# Patient Record
Sex: Male | Born: 1975 | Race: White | Hispanic: No | Marital: Single | Smoking: Current every day smoker
Health system: Southern US, Community
[De-identification: ages and names within clinical notes are randomized; demographics above are authoritative.]

## PROBLEM LIST (undated history)

## (undated) DIAGNOSIS — F32A Depression, unspecified: Secondary | ICD-10-CM

## (undated) DIAGNOSIS — M1990 Unspecified osteoarthritis, unspecified site: Secondary | ICD-10-CM

## (undated) DIAGNOSIS — F419 Anxiety disorder, unspecified: Secondary | ICD-10-CM

## (undated) DIAGNOSIS — K420 Umbilical hernia with obstruction, without gangrene: Secondary | ICD-10-CM

## (undated) DIAGNOSIS — B182 Chronic viral hepatitis C: Secondary | ICD-10-CM

## (undated) DIAGNOSIS — F1910 Other psychoactive substance abuse, uncomplicated: Secondary | ICD-10-CM

## (undated) HISTORY — DX: Chronic viral hepatitis C: B18.2

---

## 2004-08-27 ENCOUNTER — Emergency Department (HOSPITAL_COMMUNITY): Admission: EM | Admit: 2004-08-27 | Discharge: 2004-08-27 | Payer: Self-pay | Admitting: Emergency Medicine

## 2016-09-02 ENCOUNTER — Emergency Department (HOSPITAL_BASED_OUTPATIENT_CLINIC_OR_DEPARTMENT_OTHER)
Admission: EM | Admit: 2016-09-02 | Discharge: 2016-09-02 | Disposition: A | Discharge status: Home / Self Care | Payer: BLUE CROSS/BLUE SHIELD | Attending: Emergency Medicine | Admitting: Emergency Medicine

## 2016-09-02 ENCOUNTER — Encounter (HOSPITAL_BASED_OUTPATIENT_CLINIC_OR_DEPARTMENT_OTHER): Payer: Self-pay | Admitting: *Deleted

## 2016-09-02 DIAGNOSIS — L0231 Cutaneous abscess of buttock: Secondary | ICD-10-CM | POA: Diagnosis present

## 2016-09-02 DIAGNOSIS — F17200 Nicotine dependence, unspecified, uncomplicated: Secondary | ICD-10-CM | POA: Insufficient documentation

## 2016-09-02 DIAGNOSIS — L739 Follicular disorder, unspecified: Secondary | ICD-10-CM | POA: Diagnosis not present

## 2016-09-02 MED ORDER — CEPHALEXIN 500 MG PO CAPS: 500.0000 mg | ORAL_CAPSULE | Freq: Four times a day (QID) | ORAL | 0 refills | Status: DC

## 2016-09-02 MED ORDER — MUPIROCIN CALCIUM 2 % EX CREA: 1.0000 "application " | TOPICAL_CREAM | Freq: Two times a day (BID) | CUTANEOUS | 0 refills | Status: DC

## 2016-09-02 MED ORDER — MUPIROCIN CALCIUM 2 % NA OINT: TOPICAL_OINTMENT | NASAL | 0 refills | Status: DC

## 2016-09-02 NOTE — ED Provider Notes (Signed)
[facility] Provider [facility]   CSN: [id number] Arrival date & time: 09/02/16  1132     History   Chief Complaint Chief Complaint  Patient presents with  . Abscess    HPI Chris Hamilton is a 41 y.o. male with no pertinent past medical history presents with multiple, small pustules to his left axilla that he noticed 2 days ago. Patient states he has similar lesions on his back, has had this for many years but never on his axilla. Patient states that his roommate had to go to the emergency department for an abscess on his arm last week. Patient states that his roommate's girlfriend had multiple abscesses on her buttock. Patient is concerned that he has a staph infection that was obtained from his roommate and his girlfriend. Patient denies fevers. Patient denies previous abscesses or pustules to his axilla. Patient states he does not shave his axilla, states that he trimmed the hair yesterday so he could see the lesions on his skin better. Patient is requesting antibiotics. No h/o DM. No recent exposure to hot tubs.  No new perfumes, lotions, deodorants.  Rash only on left axilla, nowhere else in his body.  HPI  History reviewed. No pertinent past medical history.  There are no active problems to display for this patient.   History reviewed. No pertinent surgical history.     Home Medications    Prior to Admission medications   Medication Sig Start Date End Date Taking? Authorizing Provider  cephALEXin (KEFLEX) 500 MG capsule Take 1 capsule (500 mg total) by mouth 4 (four) times daily. 09/02/16   Liberty Handylaudia J Jhair Witherington, PA-C  mupirocin nasal ointment (BACTROBAN) 2 % Apply in each nostril daily 09/02/16   Liberty Handylaudia J Naylea Wigington, PA-C    Family History No family history on file.  Social History Social History  Substance Use Topics  . Smoking status: Current Every Day Smoker  . Smokeless tobacco: Never Used  . Alcohol use No     Allergies   Patient has no known  allergies.   Review of Systems Review of Systems  Constitutional: Negative for chills and fever.  HENT: Negative for congestion.   Eyes: Negative for visual disturbance.  Respiratory: Negative for cough and shortness of breath.   Cardiovascular: Negative for chest pain.  Gastrointestinal: Negative for abdominal pain, constipation, diarrhea, nausea and vomiting.  Genitourinary: Negative for difficulty urinating, flank pain and hematuria.  Musculoskeletal: Negative for joint swelling and myalgias.  Skin: Positive for rash (pustules).  Neurological: Negative for headaches.  Hematological: Negative.   Psychiatric/Behavioral: Negative.      Physical Exam Updated Vital Signs BP 134/92   Pulse 92   Temp 97.8 F (36.6 C) (Oral)   Resp 20   Ht 6\' 2"  (1.88 m)   Wt 99.8 kg   SpO2 100%   BMI 28.25 kg/m   Physical Exam  Constitutional: He is oriented to person, place, and time. He appears well-developed and well-nourished. No distress.  NAD.  HENT:  Head: Normocephalic and atraumatic.  Right Ear: External ear normal.  Left Ear: External ear normal.  Eyes: Conjunctivae and EOM are normal. Pupils are equal, round, and reactive to light. No scleral icterus.  Neck: Normal range of motion. Neck supple. No JVD present. No tracheal deviation present.  Cardiovascular: Normal rate, regular rhythm and normal heart sounds.   No murmur heard. Pulmonary/Chest: Effort normal and breath sounds normal. He has no wheezes.  Musculoskeletal: Normal range of motion. He exhibits no  deformity.  Lymphadenopathy:    He has no cervical adenopathy.  Neurological: He is alert and oriented to person, place, and time.  Skin: Skin is warm and dry. Capillary refill takes less than 2 seconds.  Multiple, tiny pustules on left axilla without surrounding erythema or discharge.  No induration, fluctuance or signs of abscess of cellulitis.   Psychiatric: He has a normal mood and affect. His behavior is normal.  Judgment and thought content normal.  Nursing note and vitals reviewed.    ED Treatments / Results  Labs (all labs ordered are listed, but only abnormal results are displayed) Labs Reviewed - No data to display  EKG  EKG Interpretation None       Radiology No results found.  Procedures Procedures (including critical care time)  Medications Ordered in ED Medications - No data to display   Initial Impression / Assessment and Plan / ED Course  I have reviewed the triage vital signs and the nursing notes.  Pertinent labs & imaging results that were available during my care of the patient were reviewed by me and considered in my medical decision making (see chart for details).     Signs and symptoms most consistent with folliculitis. No signs of fluctuance, induration or cellulitis on exam. Patient does not have a history of diabetes or other medical conditions that would put him at risk for developing worsening skin infection. I suspect patient will respond appropriately to antibiotic ointment. Patient provided reassurance. Patient requested for oral antibiotics, I discussed risks of overusing antibiotics that could lead to resistance, patient verbalized understanding and instill one antibiotics. Patient discharged with Keflex.  Final Clinical Impressions(s) / ED Diagnoses   Final diagnoses:  Folliculitis    New Prescriptions Discharge Medication List as of 09/02/2016 12:51 PM    START taking these medications   Details  cephALEXin (KEFLEX) 500 MG capsule Take 1 capsule (500 mg total) by mouth 4 (four) times daily., Starting Fri 09/02/2016, Print    mupirocin cream (BACTROBAN) 2 % Apply 1 application topically 2 (two) times daily., Starting Fri 09/02/2016, Print         Liberty Handy, PA-C 09/02/16 1610    Alvira Monday, MD 09/05/16 [phone]

## 2016-09-02 NOTE — ED Triage Notes (Signed)
Abscess to his left axilla.

## 2016-09-02 NOTE — Discharge Instructions (Signed)
Your symptoms are most consistent with folliculitis or infection of the hair follicle. Please apply topical antibiotic cream to affected area, ice to decrease inflammation, avoid picking at the skin, keep area dry and to decrease inflammation. He requested antibiotics today, these were given to you.  Return to the emergency department if notice swelling, redness, tenderness as you may develop an abscess which may need to be drained.

## 2020-02-27 ENCOUNTER — Ambulatory Visit (HOSPITAL_COMMUNITY): Payer: No Payment, Other | Admitting: Licensed Clinical Social Worker

## 2020-03-17 ENCOUNTER — Ambulatory Visit (HOSPITAL_COMMUNITY): Payer: No Payment, Other | Admitting: Psychiatry

## 2020-06-02 ENCOUNTER — Ambulatory Visit (HOSPITAL_COMMUNITY): Payer: No Payment, Other | Admitting: Behavioral Health

## 2020-07-25 DIAGNOSIS — B029 Zoster without complications: Secondary | ICD-10-CM

## 2020-07-25 HISTORY — DX: Zoster without complications: B02.9

## 2021-02-16 ENCOUNTER — Emergency Department (HOSPITAL_COMMUNITY)
Admission: EM | Admit: 2021-02-16 | Discharge: 2021-02-16 | Disposition: A | Discharge status: Home / Self Care | Payer: Self-pay | Attending: Emergency Medicine | Admitting: Emergency Medicine

## 2021-02-16 ENCOUNTER — Other Ambulatory Visit: Payer: Self-pay

## 2021-02-16 ENCOUNTER — Encounter (HOSPITAL_COMMUNITY): Payer: Self-pay

## 2021-02-16 DIAGNOSIS — Z5321 Procedure and treatment not carried out due to patient leaving prior to being seen by health care provider: Secondary | ICD-10-CM | POA: Insufficient documentation

## 2021-02-16 DIAGNOSIS — M79672 Pain in left foot: Secondary | ICD-10-CM | POA: Insufficient documentation

## 2021-02-16 NOTE — ED Triage Notes (Signed)
Unable to sign, no pad available.

## 2021-02-16 NOTE — ED Triage Notes (Signed)
Pt states that about a month ago he started having some pain in his L foot, states that it feels like there is a piece of glass in his foot, denies trauma or stepping on something.  Pt states that he is here because he continues to have pain.

## 2021-06-13 ENCOUNTER — Emergency Department (HOSPITAL_BASED_OUTPATIENT_CLINIC_OR_DEPARTMENT_OTHER)
Admission: EM | Admit: 2021-06-13 | Discharge: 2021-06-13 | Disposition: A | Discharge status: Home / Self Care | Payer: Self-pay | Attending: Emergency Medicine | Admitting: Emergency Medicine

## 2021-06-13 ENCOUNTER — Encounter (HOSPITAL_BASED_OUTPATIENT_CLINIC_OR_DEPARTMENT_OTHER): Payer: Self-pay | Admitting: Emergency Medicine

## 2021-06-13 ENCOUNTER — Other Ambulatory Visit: Payer: Self-pay

## 2021-06-13 DIAGNOSIS — Z87891 Personal history of nicotine dependence: Secondary | ICD-10-CM | POA: Insufficient documentation

## 2021-06-13 DIAGNOSIS — B029 Zoster without complications: Secondary | ICD-10-CM | POA: Insufficient documentation

## 2021-06-13 MED ORDER — CELECOXIB 200 MG PO CAPS: 200.0000 mg | ORAL_CAPSULE | Freq: Two times a day (BID) | ORAL | 0 refills | Status: DC

## 2021-06-13 MED ORDER — VALACYCLOVIR HCL 1 G PO TABS: 1000.0000 mg | ORAL_TABLET | Freq: Three times a day (TID) | ORAL | 0 refills | Status: DC

## 2021-06-13 NOTE — Discharge Instructions (Addendum)
Contact a health care provider if: Your pain is not relieved with prescribed medicines. Your pain does not get better after the rash heals. You have any of these signs of infection: More redness, swelling, or pain around the rash. Fluid or blood coming from the rash. Warmth coming from your rash. Pus or a bad smell coming from the rash. A fever. Get help right away if: The rash is on your face or nose. You have facial pain, pain around your eye area, or loss of feeling on one side of your face. You have difficulty seeing. You have ear pain or have ringing in your ear. You have a loss of taste. Your condition gets worse.

## 2021-06-13 NOTE — ED Provider Notes (Signed)
[facility] EMERGENCY DEPARTMENT Provider Note   CSN: [id number] Arrival date & time: 06/13/21  1019     History Chief Complaint  Patient presents with   Rash    Chris Hamilton is a 45 y.o. male who presents emergency department chief complaint of rash.  Patient states that he had onset of a rash on the right side of his flank region starting 5 days ago.  It has progressively worsened.  He notes some itching at first but now very painful, achy, sharp at times, worse at night.  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  Pain is moderate to severe at times.  Has not taken anything for it.  He denies fevers, chills, other complaints.   Rash Associated symptoms: no fever       History reviewed. No pertinent past medical history.  There are no problems to display for this patient.   History reviewed. No pertinent surgical history.     History reviewed. No pertinent family history.  Social History   Tobacco Use   Smoking status: Former    Packs/day: 1.00    Types: Cigarettes   Smokeless tobacco: Never  Vaping Use   Vaping Use: Every day  Substance Use Topics   Alcohol use: No   Drug use: Never    Home Medications Prior to Admission medications   Medication Sig Start Date End Date Taking? Authorizing Provider  celecoxib (CELEBREX) 200 MG capsule Take 1 capsule (200 mg total) by mouth 2 (two) times daily. 06/13/21  Yes Keiyana Stehr, PA-C  valACYclovir (VALTREX) 1000 MG tablet Take 1 tablet (1,000 mg total) by mouth 3 (three) times daily. 06/13/21  Yes Jolene Guyett, PA-C  cephALEXin (KEFLEX) 500 MG capsule Take 1 capsule (500 mg total) by mouth 4 (four) times daily. 09/02/16   Liberty Handy, PA-C  mupirocin nasal ointment (BACTROBAN) 2 % Apply in each nostril daily 09/02/16   Liberty Handy, PA-C    Allergies    Patient has no known allergies.  Review of Systems   Review of  Systems  Constitutional:  Negative for chills and fever.  Skin:  Positive for rash.   Physical Exam Updated Vital Signs BP (!) 131/91 (BP Location: Right Arm)   Pulse 90   Temp 98.4 F (36.9 C) (Oral)   Resp 20   Ht 6\' 2"  (1.88 m)   Wt 121.1 kg   SpO2 100%   BMI 34.28 kg/m   Physical Exam Vitals and nursing note reviewed.  Constitutional:      General: He is not in acute distress.    Appearance: He is well-developed. He is not diaphoretic.  HENT:     Head: Normocephalic and atraumatic.  Eyes:     General: No scleral icterus.    Conjunctiva/sclera: Conjunctivae normal.  Cardiovascular:     Rate and Rhythm: Normal rate and regular rhythm.     Heart sounds: Normal heart sounds.  Pulmonary:     Effort: Pulmonary effort is normal. No respiratory distress.     Breath sounds: Normal breath sounds.  Abdominal:     Palpations: Abdomen is soft.     Tenderness: There is no abdominal tenderness.  Musculoskeletal:     Cervical back: Normal range of motion and neck supple.  Skin:    General: Skin is warm and dry.     Findings: Rash present.     Comments:  Erythematous, confluent area of vesicles on the right flank in a dermatomal pattern wrapping around the side to the front of the abdomen.  No signs of secondary cellulitis.  Neurological:     Mental Status: He is alert.  Psychiatric:        Behavior: Behavior normal.    ED Results / Procedures / Treatments   Labs (all labs ordered are listed, but only abnormal results are displayed) Labs Reviewed - No data to display  EKG None  Radiology No results found.  Procedures Procedures   Medications Ordered in ED Medications - No data to display  ED Course  I have reviewed the triage vital signs and the nursing notes.  Pertinent labs & imaging results that were available during my care of the patient were reviewed by me and considered in my medical decision making (see chart for details).    MDM  Rules/Calculators/A&P                         Patient here with shingles eruption on the right flank.  I suspect that this is the cause of his aching pain.  He has noticed some darkness in his urine but denies any severe pain and has had a kidney stone in the past he states is not like that.  Patient declines any further work-up including a urinalysis.  Patient will be discharged with Celebrex and Valtrex.  Discussed outpatient follow-up and return precautions. Final Clinical Impression(s) / ED Diagnoses Final diagnoses:  Herpes zoster without complication    Rx / DC Orders ED Discharge Orders          Ordered    valACYclovir (VALTREX) 1000 MG tablet  3 times daily        06/13/21 1104    celecoxib (CELEBREX) 200 MG capsule  2 times daily        06/13/21 1104             Arthor Captain, PA-C 06/13/21 1108    Terrilee Files, MD 06/13/21 1750

## 2021-06-13 NOTE — ED Triage Notes (Signed)
Pt arrives pov with c/o painful rash on anterior and posterior torso x 3 days, and RLQ pain x 5 days. Pt denies fever

## 2021-08-25 ENCOUNTER — Ambulatory Visit: Payer: Self-pay | Admitting: Surgery

## 2021-08-25 NOTE — H&P (Signed)
. °  Chris Hamilton °[id number]  ° °Referring Provider:  Self ° ° °Subjective  ° °Chief Complaint: Hernia °  ° ° °History of Present Illness: °   °Very pleasant and otherwise healthy 46-year-old man presents with an increasingly symptomatic incarcerated umbilical hernia.  He actually came to see us a few years ago but it was relatively small and asymptomatic at the time and surgery was not recommended.  He then lost his insurance, which she has now regained, and in the interim the hernia has increased in size significantly.  He has never had any surgery.  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 ° ° °Review of Systems: °A complete review of systems was obtained from the patient.  I have reviewed this information and discussed as appropriate with the patient.  See HPI as well for other ROS. ° ° °Medical History: °Past Medical History:  °Diagnosis Date  ° Substance abuse (CMS-HCC)   ° ° °There is no problem list on file for this patient. ° ° °History reviewed. No pertinent surgical history.  ° °No Known Allergies ° °Current Outpatient Medications on File Prior to Visit  °Medication Sig Dispense Refill  ° celecoxib (CELEBREX) 200 MG capsule Take 200 mg by mouth 2 (two) times daily    ° °No current facility-administered medications on file prior to visit.  ° ° °Family History  °Problem Relation Age of Onset  ° Breast cancer Mother   ° Diabetes Maternal Grandmother   ° High blood pressure (Hypertension) Maternal Grandfather   °  ° °Social History  ° °Tobacco Use  °Smoking Status Former  ° Types: Cigarettes  ° Quit date: 04/2021  ° Years since quitting: 0.3  °Smokeless Tobacco Never  °  ° °Social History  ° °Socioeconomic History  ° Marital status: Single  °Tobacco Use  ° Smoking status: Former  °  Types: Cigarettes  °  Quit date: 04/2021  °  Years since quitting: 0.3  ° Smokeless tobacco: Never  °Substance and Sexual Activity  ° Alcohol use: Never  ° Drug use: Never  ° ° °Objective:  ° ° °Vitals:  ° 08/25/21 1453  °BP: 120/86   °Pulse: 91  °Temp: 37 °C (98.6 °F)  °SpO2: 97%  °Weight: (!) 126.9 kg (279 lb 12.8 oz)  °Height: 188 cm (6' 2")  °  °Body mass index is 35.92 kg/m². ° °Alert well-appearing °Unlabored respirations °Abdomen soft, nontender, nondistended, there is a incarcerated umbilical hernia with a large amount of incarcerated omentum, thinning of the overlying skin.  No tenderness or ulceration.  Fascial defect is not palpable ° °Assessment and Plan:  °Diagnoses and all orders for this visit: ° °Umbilical hernia without obstruction and without gangrene ° ° We discussed the relevant anatomy and technique of repair including open and laparoscopic approach.  Given how much tissue was incarcerated in the hernia sac he will need an incision there regardless so I recommend proceeding with an open repair likely with mesh depending on the size of the hernia defect.  Discussed risks of bleeding, infection, pain, scarring, injury to intra-abdominal structures, hernia recurrence, as well as cardiovascular/thromboembolic/pulmonary risks.  Questions welcomed and answered.  We will schedule at his convenience. ° ° °Jariya Reichow AMANDA Joeanne Robicheaux, MD  °

## 2021-08-25 NOTE — H&P (View-Only) (Signed)
. °  Chris Hamilton [id number]   Referring Provider:  Self   Subjective   Chief Complaint: Hernia     History of Present Illness:    Very pleasant and otherwise healthy 46 year old man presents with an increasingly symptomatic incarcerated umbilical hernia.  He actually came to see [place] a few years ago but it was relatively small and asymptomatic at the time and surgery was not recommended.  He then lost his insurance, which she has now regained, and in the interim the hernia has increased in size significantly.  He has never had any surgery.  He is a Airline pilot at [employer].   Review of Systems: A complete review of systems was obtained from the patient.  I have reviewed this information and discussed as appropriate with the patient.  See HPI as well for other ROS.   Medical History: Past Medical History:  Diagnosis Date   Substance abuse (CMS-HCC)     There is no problem list on file for this patient.   History reviewed. No pertinent surgical history.   No Known Allergies  Current Outpatient Medications on File Prior to Visit  Medication Sig Dispense Refill   celecoxib (CELEBREX) 200 MG capsule Take 200 mg by mouth 2 (two) times daily     No current facility-administered medications on file prior to visit.    Family History  Problem Relation Age of Onset   Breast cancer Mother    Diabetes Maternal Grandmother    High blood pressure (Hypertension) Maternal Grandfather      Social History   Tobacco Use  Smoking Status Former   Types: Cigarettes   Quit date: 04/2021   Years since quitting: 0.3  Smokeless Tobacco Never     Social History   Socioeconomic History   Marital status: Single  Tobacco Use   Smoking status: Former    Types: Cigarettes    Quit date: 04/2021    Years since quitting: 0.3   Smokeless tobacco: Never  Substance and Sexual Activity   Alcohol use: Never   Drug use: Never    Objective:    Vitals:   08/25/21 1453  BP: 120/86   Pulse: 91  Temp: 37 C (98.6 F)  SpO2: 97%  Weight: (!) 126.9 kg (279 lb 12.8 oz)  Height: 188 cm (6\' 2" )    Body mass index is 35.92 kg/m.  Alert well-appearing Unlabored respirations Abdomen soft, nontender, nondistended, there is a incarcerated umbilical hernia with a large amount of incarcerated omentum, thinning of the overlying skin.  No tenderness or ulceration.  Fascial defect is not palpable  Assessment and Plan:  Diagnoses and all orders for this visit:  Umbilical hernia without obstruction and without gangrene   We discussed the relevant anatomy and technique of repair including open and laparoscopic approach.  Given how much tissue was incarcerated in the hernia sac he will need an incision there regardless so I recommend proceeding with an open repair likely with mesh depending on the size of the hernia defect.  Discussed risks of bleeding, infection, pain, scarring, injury to intra-abdominal structures, hernia recurrence, as well as cardiovascular/thromboembolic/pulmonary risks.  Questions welcomed and answered.  We will schedule at his convenience.   Hayzen Lorenson , MD

## 2021-09-16 ENCOUNTER — Encounter (HOSPITAL_BASED_OUTPATIENT_CLINIC_OR_DEPARTMENT_OTHER): Payer: Self-pay | Admitting: Surgery

## 2021-09-16 ENCOUNTER — Other Ambulatory Visit: Payer: Self-pay

## 2021-09-16 NOTE — Progress Notes (Signed)
Spoke w/ via phone for pre-op interview--- Chris Hamilton needs dos----    NONE           Hamilton results------ COVID test -----patient states asymptomatic no test needed Arrive at -------0630 NPO after MN NO Solid Food.   Med rec completed Medications to take morning of surgery -----NONE Diabetic medication ----- Patient instructed no nail polish to be worn day of surgery Patient instructed to bring photo id and insurance card day of surgery Patient aware to have Driver (ride )  Father Chris Hamilton/ caregiver    for 24 hours after surgery  Patient Special Instructions ----- Pre-Op special Istructions ----- Patient verbalized understanding of instructions that were given at this phone interview. Patient denies shortness of breath, chest pain, fever, cough at this phone interview.

## 2021-09-22 ENCOUNTER — Encounter (HOSPITAL_BASED_OUTPATIENT_CLINIC_OR_DEPARTMENT_OTHER)
Admission: RE | Disposition: A | Discharge status: Home / Self Care | Payer: Self-pay | Source: Home / Self Care | Attending: Surgery

## 2021-09-22 ENCOUNTER — Ambulatory Visit (HOSPITAL_BASED_OUTPATIENT_CLINIC_OR_DEPARTMENT_OTHER): Payer: BC Managed Care – PPO | Admitting: Anesthesiology

## 2021-09-22 ENCOUNTER — Ambulatory Visit (HOSPITAL_BASED_OUTPATIENT_CLINIC_OR_DEPARTMENT_OTHER)
Admission: RE | Admit: 2021-09-22 | Discharge: 2021-09-22 | Disposition: A | Discharge status: Home / Self Care | Payer: BC Managed Care – PPO | Attending: Surgery | Admitting: Surgery

## 2021-09-22 ENCOUNTER — Encounter (HOSPITAL_BASED_OUTPATIENT_CLINIC_OR_DEPARTMENT_OTHER): Payer: Self-pay | Admitting: Surgery

## 2021-09-22 DIAGNOSIS — K420 Umbilical hernia with obstruction, without gangrene: Secondary | ICD-10-CM | POA: Diagnosis not present

## 2021-09-22 DIAGNOSIS — Z87891 Personal history of nicotine dependence: Secondary | ICD-10-CM | POA: Diagnosis not present

## 2021-09-22 HISTORY — PX: UMBILICAL HERNIA REPAIR: SHX196

## 2021-09-22 SURGERY — REPAIR, HERNIA, UMBILICAL, ADULT
Anesthesia: General | Site: Abdomen

## 2021-09-22 MED ORDER — PROPOFOL 10 MG/ML IV BOLUS
INTRAVENOUS | Status: DC | PRN
  Administered 2021-09-22: 200 mg via INTRAVENOUS

## 2021-09-22 MED ORDER — FENTANYL CITRATE (PF) 100 MCG/2ML IJ SOLN
INTRAMUSCULAR | Status: DC | PRN
  Administered 2021-09-22 (×4): 50 ug via INTRAVENOUS

## 2021-09-22 MED ORDER — ACETAMINOPHEN 325 MG RE SUPP: 650.0000 mg | RECTAL | Status: DC | PRN

## 2021-09-22 MED ORDER — LACTATED RINGERS IV SOLN: INTRAVENOUS | Status: DC

## 2021-09-22 MED ORDER — SUGAMMADEX SODIUM 500 MG/5ML IV SOLN
INTRAVENOUS | Status: AC
  Filled 2021-09-22: qty 5

## 2021-09-22 MED ORDER — DEXAMETHASONE SODIUM PHOSPHATE 10 MG/ML IJ SOLN
INTRAMUSCULAR | Status: AC
  Filled 2021-09-22: qty 1

## 2021-09-22 MED ORDER — DEXMEDETOMIDINE (PRECEDEX) IN NS 20 MCG/5ML (4 MCG/ML) IV SYRINGE
PREFILLED_SYRINGE | INTRAVENOUS | Status: AC
  Filled 2021-09-22: qty 5

## 2021-09-22 MED ORDER — 0.9 % SODIUM CHLORIDE (POUR BTL) OPTIME
TOPICAL | Status: DC | PRN
  Administered 2021-09-22: 500 mL

## 2021-09-22 MED ORDER — DEXAMETHASONE SODIUM PHOSPHATE 4 MG/ML IJ SOLN
INTRAMUSCULAR | Status: DC | PRN
  Administered 2021-09-22: 8 mg via INTRAVENOUS

## 2021-09-22 MED ORDER — FENTANYL CITRATE (PF) 100 MCG/2ML IJ SOLN
INTRAMUSCULAR | Status: AC
  Filled 2021-09-22: qty 2

## 2021-09-22 MED ORDER — ACETAMINOPHEN 500 MG PO TABS
ORAL_TABLET | ORAL | Status: AC
  Filled 2021-09-22: qty 2

## 2021-09-22 MED ORDER — CEFAZOLIN IN SODIUM CHLORIDE 3-0.9 GM/100ML-% IV SOLN
3.0000 g | INTRAVENOUS | Status: AC
  Administered 2021-09-22: 3 g via INTRAVENOUS

## 2021-09-22 MED ORDER — KETOROLAC TROMETHAMINE 30 MG/ML IJ SOLN
INTRAMUSCULAR | Status: AC
  Filled 2021-09-22: qty 1

## 2021-09-22 MED ORDER — BUPIVACAINE-EPINEPHRINE 0.25% -1:200000 IJ SOLN
INTRAMUSCULAR | Status: DC | PRN
Start: 2021-09-22 — End: 2021-09-22
  Administered 2021-09-22: 30 mL

## 2021-09-22 MED ORDER — IBUPROFEN 800 MG PO TABS: 800.0000 mg | ORAL_TABLET | Freq: Three times a day (TID) | ORAL | 0 refills | Status: DC | PRN

## 2021-09-22 MED ORDER — GABAPENTIN 300 MG PO CAPS: 300.0000 mg | ORAL_CAPSULE | Freq: Three times a day (TID) | ORAL | 0 refills | Status: AC

## 2021-09-22 MED ORDER — FENTANYL CITRATE (PF) 100 MCG/2ML IJ SOLN: 25.0000 ug | INTRAMUSCULAR | Status: DC | PRN

## 2021-09-22 MED ORDER — METHOCARBAMOL 500 MG PO TABS: 500.0000 mg | ORAL_TABLET | Freq: Four times a day (QID) | ORAL | 0 refills | Status: DC | PRN

## 2021-09-22 MED ORDER — MIDAZOLAM HCL 2 MG/2ML IJ SOLN
INTRAMUSCULAR | Status: AC
  Filled 2021-09-22: qty 2

## 2021-09-22 MED ORDER — CEFAZOLIN SODIUM 1 G IJ SOLR
INTRAMUSCULAR | Status: AC
  Filled 2021-09-22: qty 10

## 2021-09-22 MED ORDER — SUGAMMADEX SODIUM 200 MG/2ML IV SOLN
INTRAVENOUS | Status: DC | PRN
  Administered 2021-09-22: 300 mg via INTRAVENOUS

## 2021-09-22 MED ORDER — GABAPENTIN 300 MG PO CAPS
300.0000 mg | ORAL_CAPSULE | ORAL | Status: AC
  Administered 2021-09-22: 300 mg via ORAL

## 2021-09-22 MED ORDER — DEXMEDETOMIDINE (PRECEDEX) IN NS 20 MCG/5ML (4 MCG/ML) IV SYRINGE
PREFILLED_SYRINGE | INTRAVENOUS | Status: DC | PRN
  Administered 2021-09-22: 8 ug via INTRAVENOUS
  Administered 2021-09-22: 12 ug via INTRAVENOUS

## 2021-09-22 MED ORDER — SODIUM CHLORIDE 0.9% FLUSH: 3.0000 mL | INTRAVENOUS | Status: DC | PRN

## 2021-09-22 MED ORDER — OXYCODONE HCL 5 MG PO TABS
5.0000 mg | ORAL_TABLET | Freq: Once | ORAL | Status: AC | PRN
  Administered 2021-09-22: 5 mg via ORAL

## 2021-09-22 MED ORDER — GABAPENTIN 300 MG PO CAPS
ORAL_CAPSULE | ORAL | Status: AC
  Filled 2021-09-22: qty 1

## 2021-09-22 MED ORDER — ACETAMINOPHEN 500 MG PO TABS
1000.0000 mg | ORAL_TABLET | ORAL | Status: AC
  Administered 2021-09-22: 1000 mg via ORAL

## 2021-09-22 MED ORDER — OXYCODONE HCL 5 MG/5ML PO SOLN: 5.0000 mg | Freq: Once | ORAL | Status: AC | PRN

## 2021-09-22 MED ORDER — ACETAMINOPHEN 500 MG PO TABS: 1000.0000 mg | ORAL_TABLET | Freq: Four times a day (QID) | ORAL | 0 refills | Status: DC

## 2021-09-22 MED ORDER — FENTANYL CITRATE (PF) 100 MCG/2ML IJ SOLN
25.0000 ug | INTRAMUSCULAR | Status: DC | PRN
  Administered 2021-09-22: 50 ug via INTRAVENOUS

## 2021-09-22 MED ORDER — OXYCODONE HCL 5 MG PO TABS: 5.0000 mg | ORAL_TABLET | ORAL | Status: DC | PRN

## 2021-09-22 MED ORDER — ROCURONIUM BROMIDE 10 MG/ML (PF) SYRINGE
PREFILLED_SYRINGE | INTRAVENOUS | Status: AC
  Filled 2021-09-22: qty 10

## 2021-09-22 MED ORDER — LIDOCAINE HCL (PF) 2 % IJ SOLN
INTRAMUSCULAR | Status: AC
  Filled 2021-09-22: qty 5

## 2021-09-22 MED ORDER — CHLORHEXIDINE GLUCONATE 4 % EX LIQD: 60.0000 mL | Freq: Once | CUTANEOUS | Status: DC

## 2021-09-22 MED ORDER — PROPOFOL 10 MG/ML IV BOLUS
INTRAVENOUS | Status: AC
  Filled 2021-09-22: qty 20

## 2021-09-22 MED ORDER — OXYCODONE HCL 5 MG PO TABS
ORAL_TABLET | ORAL | Status: AC
  Filled 2021-09-22: qty 1

## 2021-09-22 MED ORDER — OXYCODONE HCL 5 MG PO TABS: 5.0000 mg | ORAL_TABLET | Freq: Three times a day (TID) | ORAL | 0 refills | Status: DC | PRN

## 2021-09-22 MED ORDER — MIDAZOLAM HCL 5 MG/5ML IJ SOLN
INTRAMUSCULAR | Status: DC | PRN
Start: 2021-09-22 — End: 2021-09-22
  Administered 2021-09-22: 2 mg via INTRAVENOUS

## 2021-09-22 MED ORDER — BUPIVACAINE LIPOSOME 1.3 % IJ SUSP: 20.0000 mL | Freq: Once | INTRAMUSCULAR | Status: DC

## 2021-09-22 MED ORDER — AMISULPRIDE (ANTIEMETIC) 5 MG/2ML IV SOLN: 10.0000 mg | Freq: Once | INTRAVENOUS | Status: DC | PRN

## 2021-09-22 MED ORDER — ONDANSETRON HCL 4 MG/2ML IJ SOLN
INTRAMUSCULAR | Status: AC
  Filled 2021-09-22: qty 2

## 2021-09-22 MED ORDER — ROCURONIUM BROMIDE 100 MG/10ML IV SOLN
INTRAVENOUS | Status: DC | PRN
  Administered 2021-09-22: 50 mg via INTRAVENOUS
  Administered 2021-09-22: 20 mg via INTRAVENOUS
  Administered 2021-09-22: 10 mg via INTRAVENOUS

## 2021-09-22 MED ORDER — LIDOCAINE HCL (CARDIAC) PF 100 MG/5ML IV SOSY
PREFILLED_SYRINGE | INTRAVENOUS | Status: DC | PRN
  Administered 2021-09-22: 80 mg via INTRAVENOUS

## 2021-09-22 MED ORDER — SODIUM CHLORIDE 0.9 % IV SOLN: 250.0000 mL | INTRAVENOUS | Status: DC | PRN

## 2021-09-22 MED ORDER — ACETAMINOPHEN 325 MG PO TABS: 650.0000 mg | ORAL_TABLET | ORAL | Status: DC | PRN

## 2021-09-22 MED ORDER — ONDANSETRON HCL 4 MG/2ML IJ SOLN
INTRAMUSCULAR | Status: DC | PRN
  Administered 2021-09-22: 4 mg via INTRAVENOUS

## 2021-09-22 MED ORDER — SODIUM CHLORIDE 0.9% FLUSH: 3.0000 mL | Freq: Two times a day (BID) | INTRAVENOUS | Status: DC

## 2021-09-22 MED ORDER — CEFAZOLIN SODIUM-DEXTROSE 2-4 GM/100ML-% IV SOLN
INTRAVENOUS | Status: AC
  Filled 2021-09-22: qty 100

## 2021-09-22 SURGICAL SUPPLY — 51 items
ADH SKN CLS APL DERMABOND .7 (GAUZE/BANDAGES/DRESSINGS) ×1
APL PRP STRL LF DISP 70% ISPRP (MISCELLANEOUS) ×1
APL SKNCLS STERI-STRIP NONHPOA (GAUZE/BANDAGES/DRESSINGS) ×1
BALL CTTN LRG ABS STRL LF (GAUZE/BANDAGES/DRESSINGS) ×1
BENZOIN TINCTURE PRP APPL 2/3 (GAUZE/BANDAGES/DRESSINGS) ×1 IMPLANT
BINDER ABDOMINAL 12 ML 46-62 (SOFTGOODS) ×1 IMPLANT
BLADE CLIPPER SENSICLIP SURGIC (BLADE) IMPLANT
BLADE SURG 15 STRL LF DISP TIS (BLADE) ×1 IMPLANT
BLADE SURG 15 STRL SS (BLADE) ×2
CHLORAPREP W/TINT 26 (MISCELLANEOUS) ×2 IMPLANT
COTTONBALL LRG STERILE PKG (GAUZE/BANDAGES/DRESSINGS) ×3 IMPLANT
COVER BACK TABLE 60X90IN (DRAPES) ×3 IMPLANT
COVER MAYO STAND STRL (DRAPES) ×3 IMPLANT
DECANTER SPIKE VIAL GLASS SM (MISCELLANEOUS) IMPLANT
DERMABOND ADVANCED (GAUZE/BANDAGES/DRESSINGS) ×1
DERMABOND ADVANCED .7 DNX12 (GAUZE/BANDAGES/DRESSINGS) ×1 IMPLANT
DRAPE LAPAROTOMY 100X72 PEDS (DRAPES) ×2 IMPLANT
DRAPE UTILITY XL STRL (DRAPES) ×3 IMPLANT
DRSG TEGADERM 4X4.75 (GAUZE/BANDAGES/DRESSINGS) IMPLANT
ELECT REM PT RETURN 9FT ADLT (ELECTROSURGICAL) ×2
ELECTRODE REM PT RTRN 9FT ADLT (ELECTROSURGICAL) ×2 IMPLANT
GAUZE 4X4 16PLY ~~LOC~~+RFID DBL (SPONGE) ×3 IMPLANT
GAUZE SPONGE 4X4 12PLY STRL (GAUZE/BANDAGES/DRESSINGS) ×1 IMPLANT
GLOVE SURG ENC MOIS LTX SZ6 (GLOVE) ×2 IMPLANT
GLOVE SURG UNDER POLY LF SZ6.5 (GLOVE) ×2 IMPLANT
GLOVE SURG UNDER POLY LF SZ7 (GLOVE) ×1 IMPLANT
GLOVE SURG UNDER POLY LF SZ7.5 (GLOVE) ×1 IMPLANT
GOWN STRL REUS W/TWL LRG LVL3 (GOWN DISPOSABLE) ×2 IMPLANT
KIT TURNOVER CYSTO (KITS) ×3 IMPLANT
MESH VENTRALEX ST 2.5 CRC MED (Mesh General) ×1 IMPLANT
NEEDLE HYPO 22GX1.5 SAFETY (NEEDLE) ×2 IMPLANT
NS IRRIG 500ML POUR BTL (IV SOLUTION) ×1 IMPLANT
PACK BASIN DAY SURGERY FS (CUSTOM PROCEDURE TRAY) ×2 IMPLANT
PENCIL SMOKE EVACUATOR (MISCELLANEOUS) ×3 IMPLANT
SLEEVE SCD COMPRESS KNEE MED (STOCKING) ×2 IMPLANT
STRIP CLOSURE SKIN 1/2X4 (GAUZE/BANDAGES/DRESSINGS) ×1 IMPLANT
STRIP CLOSURE SKIN 1/4X4 (GAUZE/BANDAGES/DRESSINGS) IMPLANT
SUT ETHIBOND 0 MO6 C/R (SUTURE) ×1 IMPLANT
SUT MNCRL AB 4-0 PS2 18 (SUTURE) ×2 IMPLANT
SUT VIC AB 0 SH 27 (SUTURE) IMPLANT
SUT VIC AB 2-0 SH 27 (SUTURE)
SUT VIC AB 2-0 SH 27XBRD (SUTURE) IMPLANT
SUT VIC AB 3-0 SH 27 (SUTURE) ×2
SUT VIC AB 3-0 SH 27X BRD (SUTURE) ×1 IMPLANT
SUT VICRYL 3 0 BR 18  UND (SUTURE) ×2
SUT VICRYL 3 0 BR 18 UND (SUTURE) IMPLANT
SYR BULB IRRIG 60ML STRL (SYRINGE) IMPLANT
SYR CONTROL 10ML LL (SYRINGE) ×3 IMPLANT
TOWEL OR 17X26 10 PK STRL BLUE (TOWEL DISPOSABLE) ×3 IMPLANT
TUBE CONNECTING 12X1/4 (SUCTIONS) ×2 IMPLANT
YANKAUER SUCT BULB TIP NO VENT (SUCTIONS) ×2 IMPLANT

## 2021-09-22 NOTE — Discharge Instructions (Addendum)
HERNIA REPAIR: POST OP INSTRUCTIONS ? ? ?EAT ?Gradually transition to a high fiber diet with a fiber supplement over the next few weeks after discharge.  Start with a pureed / full liquid diet (see below) ? ?WALK ?Walk an hour a day (cumulative- not all at once).  Control your pain to do that.   ? ?CONTROL PAIN ?Control pain so that you can walk, sleep, tolerate sneezing/coughing, and go up/down stairs. ? ?HAVE A BOWEL MOVEMENT DAILY ?Keep your bowels regular to avoid problems.  OK to try a laxative to override constipation.  OK to use an antidairrheal to slow down diarrhea.  Call if not better after 2 tries ? ?CALL IF YOU HAVE PROBLEMS/CONCERNS ?Call if you are still struggling despite following these instructions. ?Call if you have concerns not answered by these instructions ? ?###################################################################### ? ? ? ?DIET: Follow a light bland diet & liquids the first 24 hours after arrival home, such as soup, liquids, starches, etc.  Be sure to drink plenty of fluids.  Quickly advance to a usual solid diet within a few days.  Avoid fast food or heavy meals as your are more likely to get nauseated or have irregular bowels.  A low-sugar, high-fiber diet for the rest of your life is ideal.  ? ?Take your usually prescribed home medications unless otherwise directed. ? ?PAIN CONTROL: ?Pain is best controlled by a usual combination of three different methods TOGETHER: ?Ice/Heat ?Over the counter pain medication ?Prescription pain medication ?Most patients will experience some swelling and bruising around the hernia(s) such as the bellybutton, groins, or old incisions.  Ice packs or heating pads (30-60 minutes up to 6 times a day) will help. Use ice for the first few days to help decrease swelling and bruising, then switch to heat to help relax tight/sore spots and speed recovery.  Some people prefer to use ice alone, heat alone, alternating between ice & heat.  Experiment to what  works for you.  Swelling and bruising can take several weeks to resolve.   ?It is helpful to take an over-the-counter pain medication regularly for the first days: ?Naproxen (Aleve, etc)  Two 220mg tabs twice a day OR Ibuprofen (Advil, etc) Three 200mg tabs four times a day (every meal & bedtime) AND ?Acetaminophen (Tylenol, etc) 325-650mg four times a day (every meal & bedtime) ?A  prescription for pain medication should be given to you upon discharge.  Take your pain medication as prescribed, IF NEEDED.  ?If you are having problems/concerns with the prescription medicine (does not control pain, nausea, vomiting, rash, itching, etc), please call us [phone] to see if we need to switch you to a different pain medicine that will work better for you and/or control your side effect better. ?If you need a refill on your pain medication, please contact your pharmacy.  They will contact our office to request authorization. Prescriptions will not be filled after 5 pm or on week-ends. ? ?Avoid getting constipated.  Between the surgery and the pain medications, it is common to experience some constipation.  Increasing fluid intake and taking a fiber supplement (such as Metamucil, Citrucel, FiberCon, MiraLax, etc) 1-2 times a day regularly will usually help prevent this problem from occurring.  A mild laxative (prune juice, Milk of Magnesia, MiraLax, etc) should be taken according to package directions if there are no bowel movements after 48 hours.   ? ?Wash / shower every day, starting 2 days after surgery.  You may shower over the   steri strips which are waterproof.  No soaking or swimming, no rubbing/scrubbing/lotions/ointments to incision until fully healed. ? ?Remove your outer bandage 2 days after surgery. Steri strips will peel off after 1-2 weeks.  You may leave the incision open to air.  You may replace a dressing/Band-Aid to cover an incision for comfort if you wish.  Continue to shower over incision(s) after  the dressing is off. ? ?ACTIVITIES as tolerated:   ?You may resume regular (light) daily activities beginning the next day--such as daily self-care, walking, climbing stairs--gradually increasing activities as tolerated.  Control your pain so that you can walk an hour a day.  If you can walk 30 minutes without difficulty, it is safe to try more intense activity such as jogging, treadmill, bicycling, low-impact aerobics, swimming, etc. ?Refrain from the most intensive and strenuous activity such as sit-ups, heavy lifting, contact sports, etc  Refrain from any heavy lifting or straining until 6 weeks after surgery.   ?DO NOT PUSH THROUGH PAIN.  Let pain be your guide: If it hurts to do something, don't do it.  Pain is your body warning you to avoid that activity for another week until the pain goes down. ?Wear your abdominal binder at all times for the first week after surgery. Then wear when you are up moving around. This will help reduce pain and will reduce the chance of fluid build-up under your incision.  ?You may drive when you are no longer taking prescription pain medication, you can comfortably wear a seatbelt, and you can safely maneuver your car and apply brakes. ?You may have sexual intercourse when it is comfortable.  ? ?FOLLOW UP in our office ?Please call [facility] at [phone] to set up an appointment to see your surgeon in the office for a follow-up appointment approximately 2-3 weeks after your surgery. ?Make sure that you call for this appointment the day you arrive home to insure a convenient appointment time. ? ?9.  If you have disability of FMLA / Family leave forms, please bring the forms to the office for processing.  (do not give to your surgeon). ? ?WHEN TO CALL us [phone]: ?Poor pain control ?Reactions / problems with new medications (rash/itching, nausea, etc)  ?Fever over 101.5 F (38.5 C) ?Inability to urinate ?Nausea and/or vomiting ?Worsening swelling or bruising ?Continued  bleeding from incision. ?Increased pain, redness, or drainage from the incision ? ? The clinic staff is available to answer your questions during regular business hours (8:30am-5pm).  Please don?t hesitate to call and ask to speak to one of our nurses for clinical concerns.  ? If you have a medical emergency, go to the nearest emergency room or call 911. ? A surgeon from [facility] is always on call at the hospitals in [place] ? ?[facility], [place] ?[address], [address], [place], [place]  [address] ? ? [address] [address], [place], [place]   [address] ?MAIN: [phone] ? TOLL FREE: [phone] ? FAX: [phone] ?[email or web address] ? ? ?Post Anesthesia Home Care Instructions ? ?Activity: ?Get plenty of rest for the remainder of the day. A responsible individual must stay with you for 24 hours following the procedure.  ?For the next 24 hours, DO NOT: ?-Drive a car ?-Advertising copywriter ?-Drink alcoholic beverages ?-Take any medication unless instructed by your physician ?-Make any legal decisions or sign important papers. ? ?Meals: ?Start with liquid foods such as gelatin or soup. Progress to regular foods as tolerated. Avoid greasy,  spicy, heavy foods. If nausea and/or vomiting occur, drink only clear liquids until the nausea and/or vomiting subsides. Call your physician if vomiting continues. ? ?Special Instructions/Symptoms: ?Your throat may feel dry or sore from the anesthesia or the breathing tube placed in your throat during surgery. If this causes discomfort, gargle with warm salt water. The discomfort should disappear within 24 hours. ? ?No acetaminophen/Tylenol until after 1:15 pm today if needed. ? ? ?

## 2021-09-22 NOTE — Op Note (Signed)
Operative Note ? ?Chris Hamilton  ?[id number]  ?[id number]  ?09/22/2021 ? ? ?Surgeon: Phylliss Blakes MD FACS ?  ?Procedure performed: Open repair of incarcerated umbilical hernia with mesh underlay, partial omentectomy, defect size approximately 2.5 cm ?  ?Preop diagnosis: Incarcerated umbilical hernia ?Post-op diagnosis/intraop findings: Same, containing large volume of omentum ?  ?Specimens: no ?Retained items: no  ?EBL: 20cc ?Complications: none ?  ?Description of procedure: After obtaining informed consent the patient was taken to the operating room and placed supine on operating room table where general endotracheal anesthesia was initiated, preoperative antibiotics were administered, SCDs applied, and a formal timeout was performed.  The abdomen was clipped, prepped and draped in usual sterile fashion.  After infiltration of local a curvilinear infraumbilical incision was made and the soft tissues dissected with cautery until the hernia sac was encountered.  This was circumferentially dissected and skeletonized down to the level of the fascia with a combination of cautery and blunt dissection.  The umbilical stalk was separated from the hernia sac with cautery.  The sac was entered and noted to contain a large volume of chronically incarcerated omentum.  The hernia sac was excised at the level of the fascia.  The omentum was unable to be reduced and therefore a partial omentectomy was performed, ligating the remainder with 3-0 Vicryl ties.  After resecting some of the omentum, the remainder was able to be reduced into the abdominal cavity.  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  A 6.4 cm Ventralex patch (largest available at this site) was selected.  This was secured to the abdominal wall with transfascial 0  Ethibonds in the 4 cardinal directions, ensuring no exposed rough surface and no entrapped structures between the mesh and the abdominal wall.  The fascial defect was then closed transversely with interrupted figure-of-eight 0 Ethibonds, each including the trimmed mesh tail to afford maximum apposition to the abdominal wall.  Hemostasis was ensured throughout the field.  Additional local was infiltrated in the prefascial plane as well as subcutaneous tissues.  The umbilical skin was sutured back down to the fascia with interrupted 3-0 Vicryl's and the deeper tissues reapproximated with interrupted 3-0 Vicryl's.  The skin was closed with running subcuticular 4 Monocryl.  Benzoin, Steri-Strips, and a pressure dressing of gauze and tape were then applied followed by an abdominal binder.  Pressure was held during extubation as the patient did have quite a bit of forceful coughing with the emergence and extubation.  The patient was then awakened, extubated and taken to PACU in stable condition.  ?  ?All counts were correct at the completion of the case.   ?

## 2021-09-22 NOTE — Transfer of Care (Signed)
Immediate Anesthesia Transfer of Care Note ? ?Patient: Chris Hamilton ? ?Procedure(s) Performed: OPEN UMBILICAL HERNIA REPAIR WITH MESH (Abdomen) ? ?Patient Location: PACU ? ?Anesthesia Type:General ? ?Level of Consciousness: awake, alert  and oriented ? ?Airway & Oxygen Therapy: Patient Spontanous Breathing and Patient connected to nasal cannula oxygen ? ?Post-op Assessment: Report given to RN and Post -op Vital signs reviewed and stable ? ?Post vital signs: Reviewed and stable ? ?Last Vitals:  ?Vitals Value Taken Time  ?BP 124/84 09/22/21 0948  ?Temp    ?Pulse 77 09/22/21 0950  ?Resp 16 09/22/21 0950  ?SpO2 99 % 09/22/21 0950  ?Vitals shown include unvalidated device data. ? ?Last Pain: There were no vitals filed for this visit.   ? ?Patients Stated Pain Goal: 5 (09/22/21 0709) ? ?Complications: No notable events documented. ?

## 2021-09-22 NOTE — Anesthesia Preprocedure Evaluation (Signed)
Anesthesia Evaluation  ?Patient identified by MRN, date of birth, ID band ?Patient awake ? ? ? ?Reviewed: ?Allergy & Precautions, NPO status , Patient's Chart, lab work & pertinent test results ? ?Airway ?Mallampati: II ? ?TM Distance: >3 FB ?Neck ROM: Full ? ? ? Dental ? ?(+) Dental Advisory Given ?  ?Pulmonary ?Patient abstained from smoking., former smoker,  ?  ?breath sounds clear to auscultation ? ? ? ? ? ? Cardiovascular ?negative cardio ROS ? ? ?Rhythm:Regular Rate:Normal ? ? ?  ?Neuro/Psych ?negative neurological ROS ?   ? GI/Hepatic ?negative GI ROS, Neg liver ROS,   ?Endo/Other  ?negative endocrine ROS ? Renal/GU ?negative Renal ROS  ? ?  ?Musculoskeletal ? ? Abdominal ?  ?Peds ? Hematology ?negative hematology ROS ?(+)   ?Anesthesia Other Findings ? ? Reproductive/Obstetrics ? ?  ? ? ? ? ? ? ? ? ? ? ? ? ? ?  ?  ? ? ? ? ? ? ? ? ?Anesthesia Physical ?Anesthesia Plan ? ?ASA: 2 ? ?Anesthesia Plan: General  ? ?Post-op Pain Management: Gabapentin PO (pre-op)*, Toradol IV (intra-op)* and Tylenol PO (pre-op)*  ? ?Induction: Intravenous ? ?PONV Risk Score and Plan: 3 and Dexamethasone, Ondansetron, Treatment may vary due to age or medical condition and Midazolam ? ?Airway Management Planned: Oral ETT and LMA ? ?Additional Equipment: None ? ?Intra-op Plan:  ? ?Post-operative Plan: Extubation in OR ? ?Informed Consent: I have reviewed the patients History and Physical, chart, labs and discussed the procedure including the risks, benefits and alternatives for the proposed anesthesia with the patient or authorized representative who has indicated his/her understanding and acceptance.  ? ? ? ?Dental advisory given ? ?Plan Discussed with: CRNA ? ?Anesthesia Plan Comments:   ? ? ? ? ? ? ?Anesthesia Quick Evaluation ? ?

## 2021-09-22 NOTE — Anesthesia Postprocedure Evaluation (Signed)
Anesthesia Post Note ? ?Patient: Chris Hamilton ? ?Procedure(s) Performed: OPEN UMBILICAL HERNIA REPAIR WITH MESH (Abdomen) ? ?  ? ?Patient location during evaluation: PACU ?Anesthesia Type: General ?Level of consciousness: awake and alert ?Pain management: pain level controlled ?Vital Signs Assessment: post-procedure vital signs reviewed and stable ?Respiratory status: spontaneous breathing, nonlabored ventilation, respiratory function stable and patient connected to nasal cannula oxygen ?Cardiovascular status: blood pressure returned to baseline and stable ?Postop Assessment: no apparent nausea or vomiting ?Anesthetic complications: no ? ? ?No notable events documented. ? ?Last Vitals:  ?Vitals:  ? 09/22/21 1015 09/22/21 1051  ?BP: 118/86 117/90  ?Pulse: 74 77  ?Resp: 12 16  ?Temp: 36.7 ?C 36.7 ?C  ?SpO2: 97% 98%  ?  ?Last Pain:  ?Vitals:  ? 09/22/21 1051  ?PainSc: 4   ? ? ?  ?  ?  ?  ?  ?  ? ?Marcene Duos E ? ? ? ? ?

## 2021-09-22 NOTE — Interval H&P Note (Signed)
History and Physical Interval Note: ? ?09/22/2021 ?8:11 AM ? ?Chris Hamilton  has presented today for surgery, with the diagnosis of INCARCERATED UMBILICAL HERNIA.  The various methods of treatment have been discussed with the patient and family. After consideration of risks, benefits and other options for treatment, the patient has consented to  Procedure(s): ?OPEN UMBILICAL HERNIA REPAIR WITH MESH (N/A) as a surgical intervention.  The patient's history has been reviewed, patient examined, no change in status, stable for surgery.  I have reviewed the patient's chart and labs.  Questions were answered to the patient's satisfaction.   ? ? ?[name] ? ? ?

## 2021-09-22 NOTE — Anesthesia Procedure Notes (Signed)
Procedure Name: Intubation ?Date/Time: 09/22/2021 8:30 AM ?Performed by: Cleda Clarks, CRNA ?Pre-anesthesia Checklist: Patient identified, Emergency Drugs available, Suction available and Patient being monitored ?Patient Re-evaluated:Patient Re-evaluated prior to induction ?Oxygen Delivery Method: Circle system utilized ?Preoxygenation: Pre-oxygenation with 100% oxygen ?Induction Type: IV induction ?Ventilation: Mask ventilation without difficulty ?Laryngoscope Size: Hyacinth Meeker and 2 ?Grade View: Grade II ?Tube type: Oral ?Tube size: 7.0 mm ?Number of attempts: 1 ?Airway Equipment and Method: Stylet and Oral airway ?Placement Confirmation: ETT inserted through vocal cords under direct vision, positive ETCO2 and breath sounds checked- equal and bilateral ?Secured at: 22 cm ?Tube secured with: Tape ?Dental Injury: Teeth and Oropharynx as per pre-operative assessment  ? ? ? ? ?

## 2021-09-24 ENCOUNTER — Encounter (HOSPITAL_BASED_OUTPATIENT_CLINIC_OR_DEPARTMENT_OTHER): Payer: Self-pay | Admitting: Surgery

## 2021-11-14 ENCOUNTER — Inpatient Hospital Stay (HOSPITAL_BASED_OUTPATIENT_CLINIC_OR_DEPARTMENT_OTHER)
Admission: EM | Admit: 2021-11-14 | Discharge: 2021-11-17 | DRG: 419 | Disposition: A | Discharge status: Home / Self Care | Payer: BC Managed Care – PPO | Attending: Internal Medicine | Admitting: Internal Medicine

## 2021-11-14 ENCOUNTER — Other Ambulatory Visit: Payer: Self-pay

## 2021-11-14 ENCOUNTER — Emergency Department (HOSPITAL_BASED_OUTPATIENT_CLINIC_OR_DEPARTMENT_OTHER): Payer: BC Managed Care – PPO

## 2021-11-14 ENCOUNTER — Encounter (HOSPITAL_BASED_OUTPATIENT_CLINIC_OR_DEPARTMENT_OTHER): Payer: Self-pay | Admitting: Emergency Medicine

## 2021-11-14 DIAGNOSIS — K810 Acute cholecystitis: Secondary | ICD-10-CM

## 2021-11-14 DIAGNOSIS — Z9049 Acquired absence of other specified parts of digestive tract: Secondary | ICD-10-CM | POA: Diagnosis present

## 2021-11-14 DIAGNOSIS — R935 Abnormal findings on diagnostic imaging of other abdominal regions, including retroperitoneum: Secondary | ICD-10-CM | POA: Diagnosis present

## 2021-11-14 DIAGNOSIS — K660 Peritoneal adhesions (postprocedural) (postinfection): Secondary | ICD-10-CM | POA: Diagnosis present

## 2021-11-14 DIAGNOSIS — K8012 Calculus of gallbladder with acute and chronic cholecystitis without obstruction: Secondary | ICD-10-CM | POA: Diagnosis not present

## 2021-11-14 DIAGNOSIS — F1911 Other psychoactive substance abuse, in remission: Secondary | ICD-10-CM | POA: Diagnosis present

## 2021-11-14 DIAGNOSIS — K819 Cholecystitis, unspecified: Secondary | ICD-10-CM

## 2021-11-14 DIAGNOSIS — R748 Abnormal levels of other serum enzymes: Secondary | ICD-10-CM | POA: Diagnosis present

## 2021-11-14 DIAGNOSIS — Z6834 Body mass index (BMI) 34.0-34.9, adult: Secondary | ICD-10-CM

## 2021-11-14 DIAGNOSIS — E669 Obesity, unspecified: Secondary | ICD-10-CM | POA: Diagnosis present

## 2021-11-14 DIAGNOSIS — K5900 Constipation, unspecified: Secondary | ICD-10-CM | POA: Diagnosis present

## 2021-11-14 DIAGNOSIS — F17290 Nicotine dependence, other tobacco product, uncomplicated: Secondary | ICD-10-CM | POA: Diagnosis present

## 2021-11-14 DIAGNOSIS — B182 Chronic viral hepatitis C: Secondary | ICD-10-CM | POA: Diagnosis present

## 2021-11-14 DIAGNOSIS — R7989 Other specified abnormal findings of blood chemistry: Secondary | ICD-10-CM | POA: Diagnosis present

## 2021-11-14 HISTORY — DX: Other psychoactive substance abuse, uncomplicated: F19.10

## 2021-11-14 HISTORY — DX: Umbilical hernia with obstruction, without gangrene: K42.0

## 2021-11-14 LAB — CBC
HCT: 39 % (ref 39.0–52.0)
Hemoglobin: 12.9 g/dL — ABNORMAL LOW (ref 13.0–17.0)
MCH: 28 pg (ref 26.0–34.0)
MCHC: 33.1 g/dL (ref 30.0–36.0)
MCV: 84.6 fL (ref 80.0–100.0)
Platelets: 196 10*3/uL (ref 150–400)
RBC: 4.61 MIL/uL (ref 4.22–5.81)
RDW: 12.6 % (ref 11.5–15.5)
WBC: 9.8 10*3/uL (ref 4.0–10.5)
nRBC: 0 % (ref 0.0–0.2)

## 2021-11-14 LAB — BASIC METABOLIC PANEL
Anion gap: 10 (ref 5–15)
BUN: 15 mg/dL (ref 6–20)
CO2: 25 mmol/L (ref 22–32)
Calcium: 8.7 mg/dL — ABNORMAL LOW (ref 8.9–10.3)
Chloride: 104 mmol/L (ref 98–111)
Creatinine, Ser: 0.68 mg/dL (ref 0.61–1.24)
GFR, Estimated: >60 mL/min (ref >60)
Glucose, Bld: 127 mg/dL — ABNORMAL HIGH (ref 70–99)
Potassium: 4.2 mmol/L (ref 3.5–5.1)
Sodium: 139 mmol/L (ref 135–145)

## 2021-11-14 LAB — URINALYSIS, ROUTINE W REFLEX MICROSCOPIC
Glucose, UA: NEGATIVE mg/dL
Hgb urine dipstick: NEGATIVE
Ketones, ur: NEGATIVE mg/dL
Leukocytes,Ua: NEGATIVE
Nitrite: NEGATIVE
Protein, ur: 30 mg/dL — AB
Specific Gravity, Urine: 1.015 (ref 1.005–1.030)
pH: 8.5 — ABNORMAL HIGH (ref 5.0–8.0)

## 2021-11-14 LAB — LIPASE, BLOOD: Lipase: 27 U/L (ref 11–51)

## 2021-11-14 LAB — HEPATIC FUNCTION PANEL
ALT: 227 U/L — ABNORMAL HIGH (ref 0–44)
AST: 236 U/L — ABNORMAL HIGH (ref 15–41)
Albumin: 4 g/dL (ref 3.5–5.0)
Alkaline Phosphatase: 332 U/L — ABNORMAL HIGH (ref 38–126)
Bilirubin, Direct: 0.5 mg/dL — ABNORMAL HIGH (ref 0.0–0.2)
Indirect Bilirubin: 0.4 mg/dL (ref 0.3–0.9)
Total Bilirubin: 0.9 mg/dL (ref 0.3–1.2)
Total Protein: 8 g/dL (ref 6.5–8.1)

## 2021-11-14 LAB — URINALYSIS, MICROSCOPIC (REFLEX): RBC / HPF: NONE SEEN RBC/hpf (ref 0–5)

## 2021-11-14 LAB — RAPID HIV SCREEN (HIV 1/2 AB+AG)
HIV 1/2 Antibodies: NONREACTIVE
HIV-1 P24 Antigen - HIV24: NONREACTIVE

## 2021-11-14 LAB — TROPONIN I (HIGH SENSITIVITY): Troponin I (High Sensitivity): 7 ng/L (ref <18)

## 2021-11-14 IMAGING — CT CT ABD-PELV W/ CM
2 of 5 series · 16 of 46 positions shown, 18 images · IV contrast (agent unspecified)
Comparison: None.

CLINICAL DATA: Abdominal pain, nausea, recent umbilical hernia
surgery

EXAM:
CT ABDOMEN AND PELVIS WITH CONTRAST
TECHNIQUE: Multidetector CT imaging of the abdomen and pelvis was performed
using the standard protocol following bolus administration of
intravenous contrast.

[Series 2: axial st · axial · 0.98mm/px · z∈[+481,+946]mm · 13 of 105 slices shown, 15 images]
[im 6/105  soft-tissue]
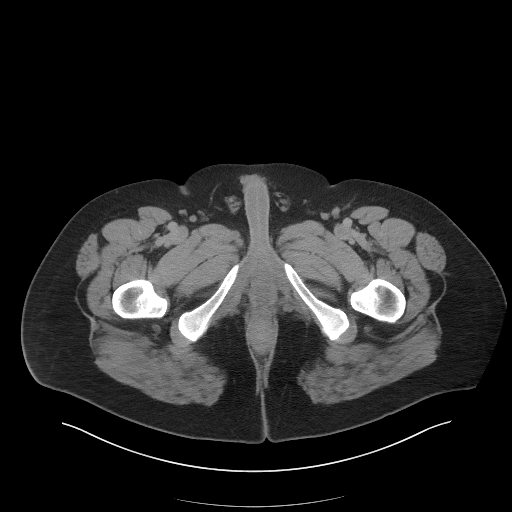
[im 6/105  bone]
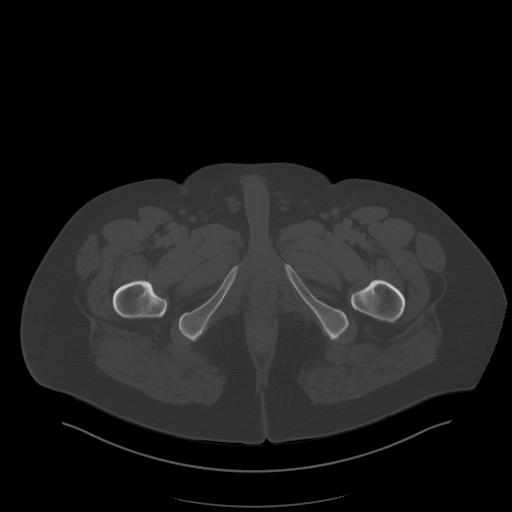
[im 16/105  soft-tissue]
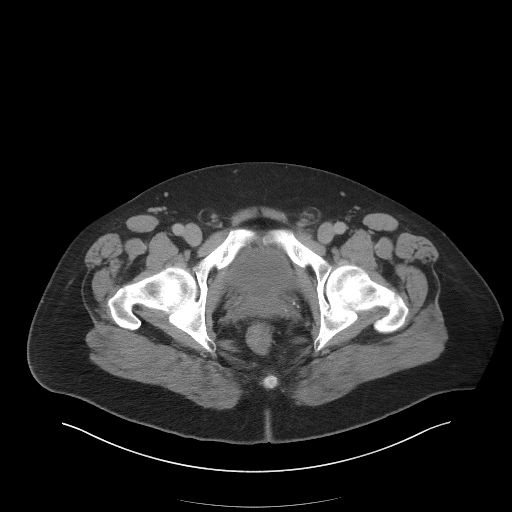
[im 21/105  soft-tissue]
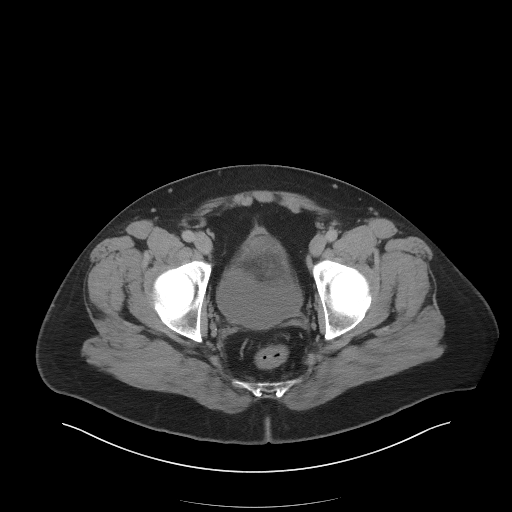
[im 32/105  soft-tissue]
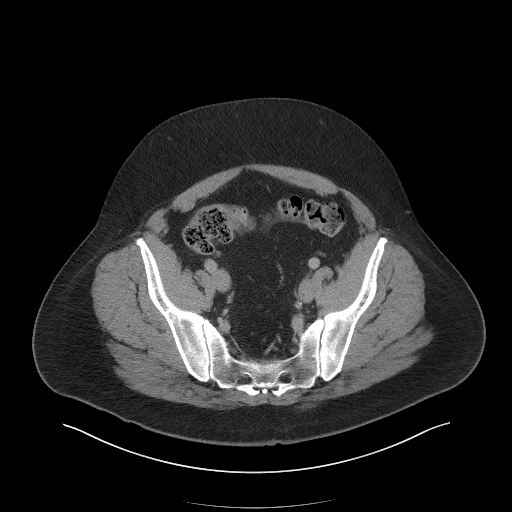
[im 37/105  soft-tissue]
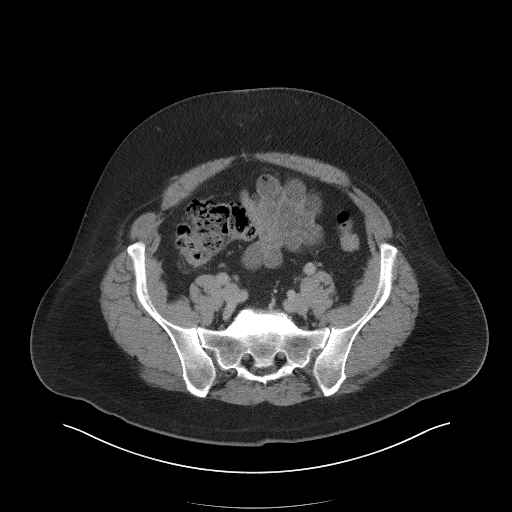
[im 47/105  soft-tissue]
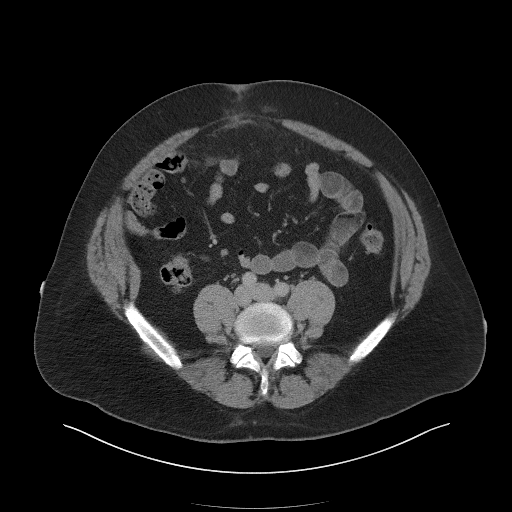
[im 53/105  soft-tissue]
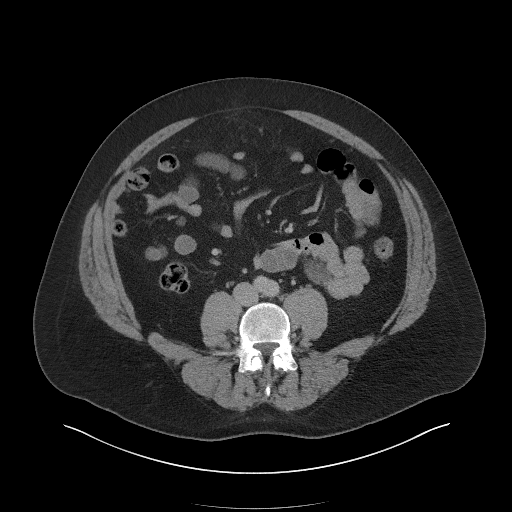
[im 58/105  soft-tissue]
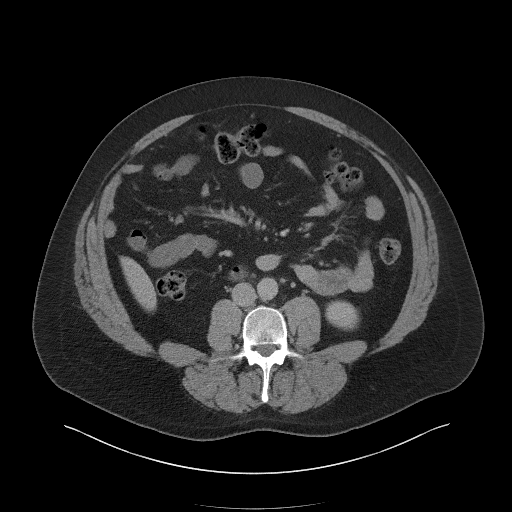
[im 68/105  soft-tissue]
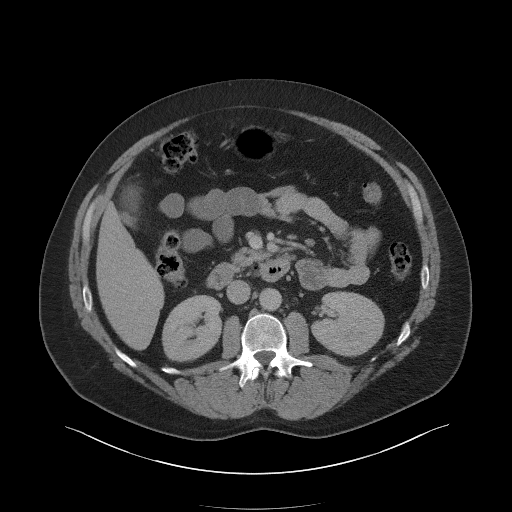
[im 68/105  bone]
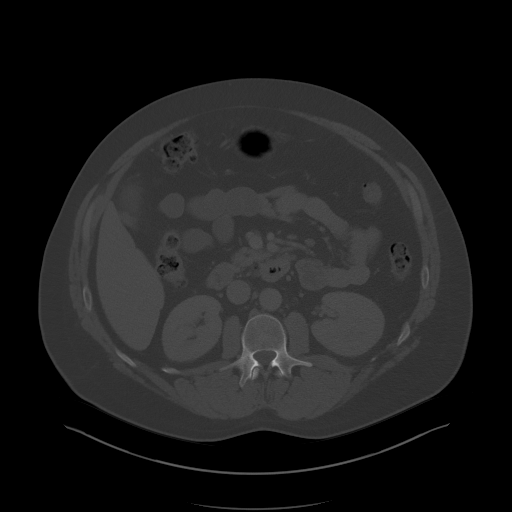
[im 73/105  soft-tissue]
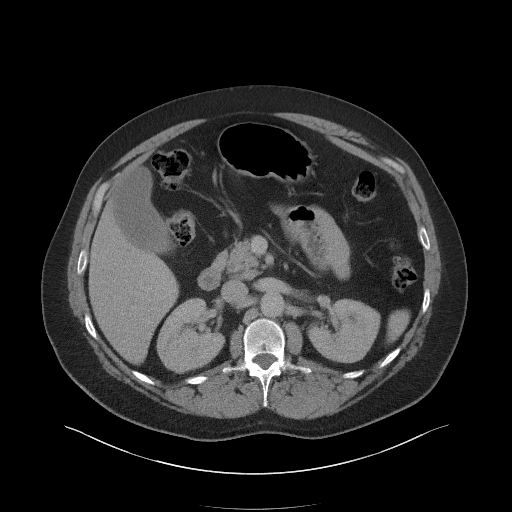
[im 84/105  soft-tissue]
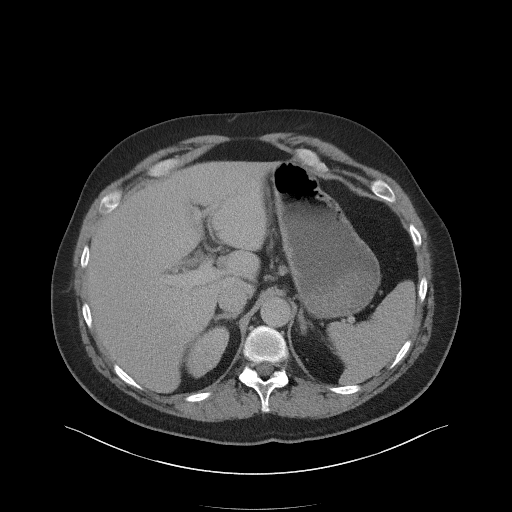
[im 89/105  soft-tissue]
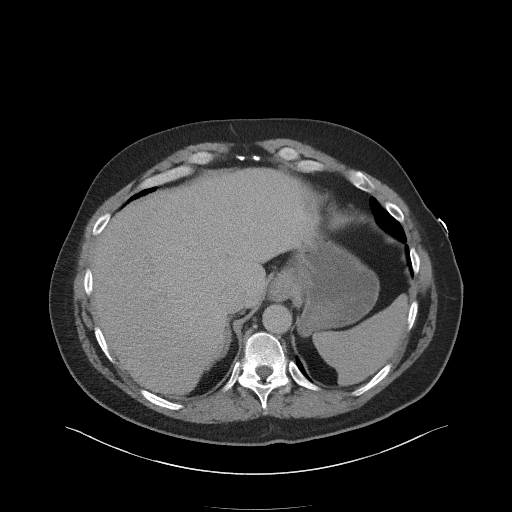
[im 99/105  soft-tissue]
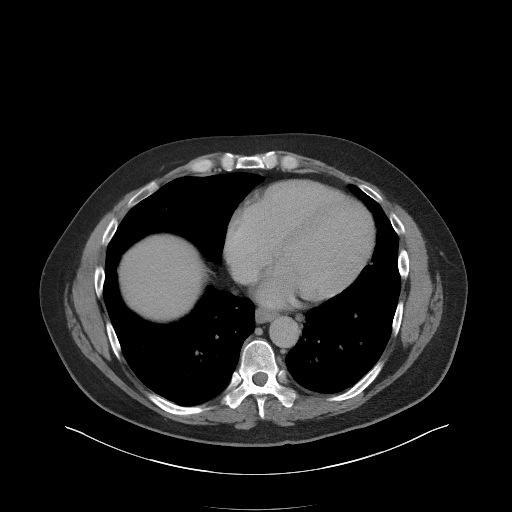

[Series 5: coronal st · coronal · 0.92mm/px · 3 of 123 slices shown]
[im 41/123  soft-tissue]
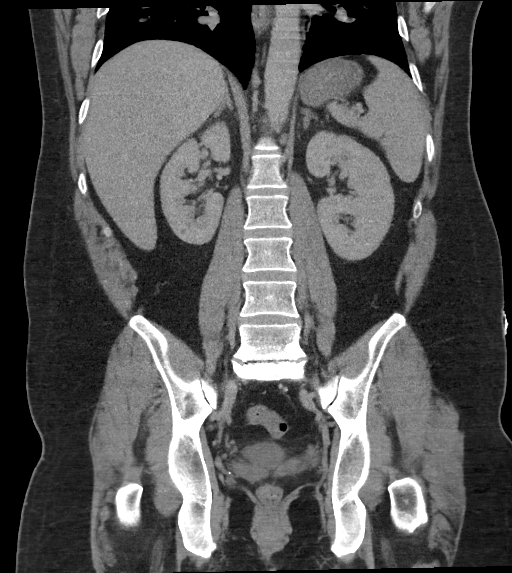
[im 55/123  soft-tissue]
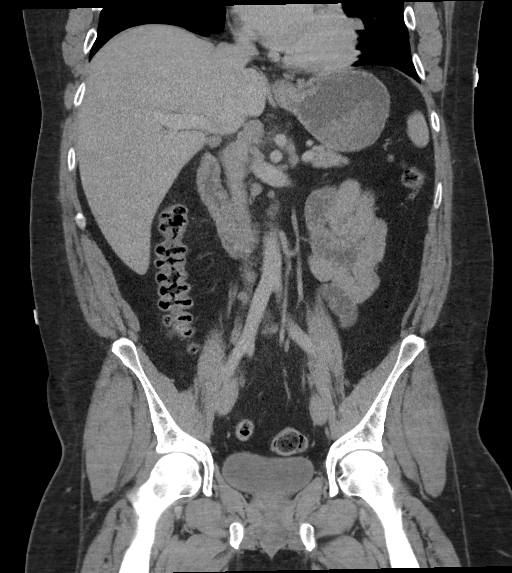
[im 68/123  soft-tissue]
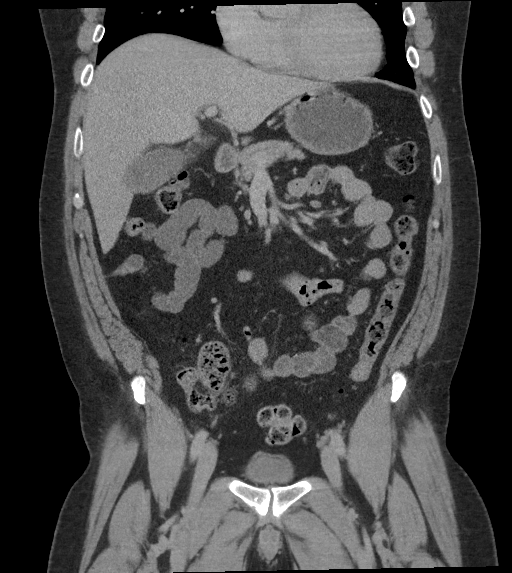

[16 of 46 positions shown; findings below may reference images not displayed]

RADIATION DOSE REDUCTION: This exam was performed according to the
departmental dose-optimization program which includes automated
exposure control, adjustment of the mA and/or kV according to
patient size and/or use of iterative reconstruction technique.

CONTRAST:  100mL OMNIPAQUE IOHEXOL 300 MG/ML  SOLN
FINDINGS: Lower chest: Lung bases are clear.

Hepatobiliary: Liver is within normal limits.

Gallbladder is unremarkable. No intrahepatic or extrahepatic ductal
dilatation.

Pancreas: Within normal limits.

Spleen: Within normal limits.

Adrenals/Urinary Tract: Adrenal glands are within normal limits.

Kidneys are within normal limits.  No hydronephrosis.

Bladder is within normal limits.

Stomach/Bowel: Stomach is within normal limits.

No evidence of bowel obstruction.

Normal appendix (series 2/image 65).

No colonic wall thickening or inflammatory changes.

Vascular/Lymphatic: No evidence of abdominal aortic aneurysm.

Small upper abdominal lymph nodes measuring up to 12 mm short axis
(series 2/image 25), likely reactive.

Reproductive: Prostate is unremarkable.

Other: No abdominopelvic ascites.

Postsurgical changes related to prior umbilical hernia repair.
Associated cutaneous thickening with phlegmonous change/stranding
(series 2/images 62-63), suspicious for cellulitis, with
subcutaneous abscess considered less likely.

Musculoskeletal: Mild degenerative changes at L5-S1.
IMPRESSION: Phlegmonous changes in the periumbilical region favors cellulitis,
with subcutaneous abscess considered less likely.

No evidence of bowel obstruction.

## 2021-11-14 IMAGING — US US ABDOMEN LIMITED
1 series · 14 of 25 positions shown · non-contrast
Comparison: None.

CLINICAL DATA: Right upper quadrant pain.

EXAM:
ULTRASOUND ABDOMEN LIMITED RIGHT UPPER QUADRANT

[Series 1: us abdomen limited · 14 of 75 slices shown]
[im 1/75]
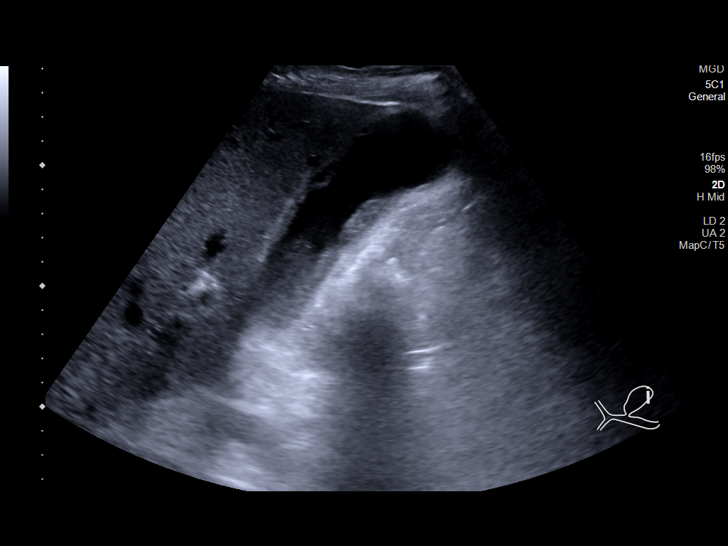
[im 7/75]
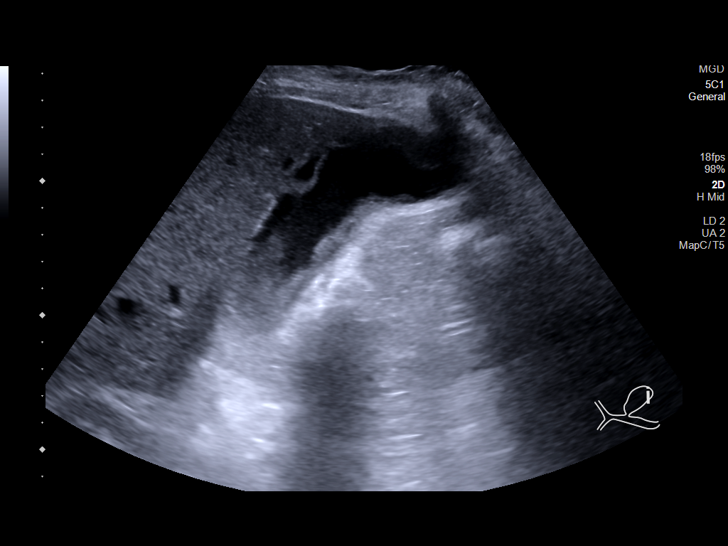
[im 13/75]
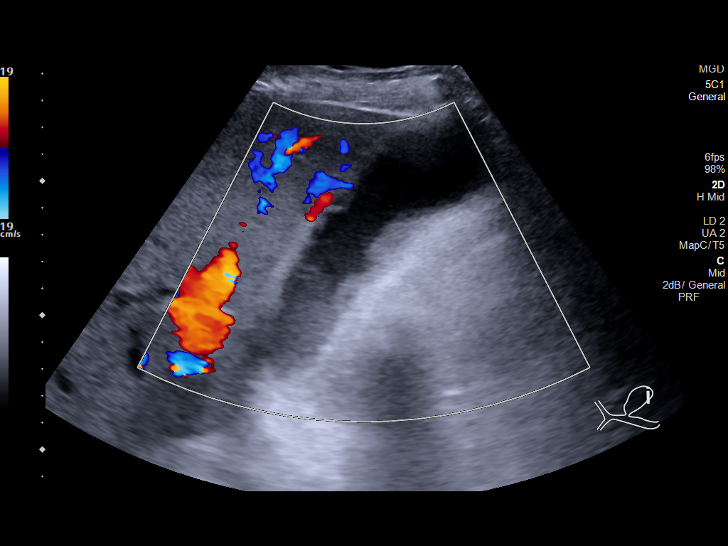
[im 19/75]
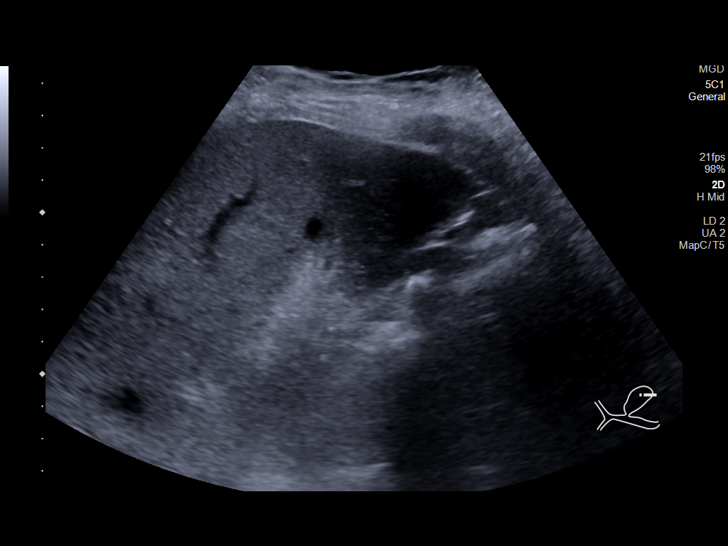
[im 25/75]
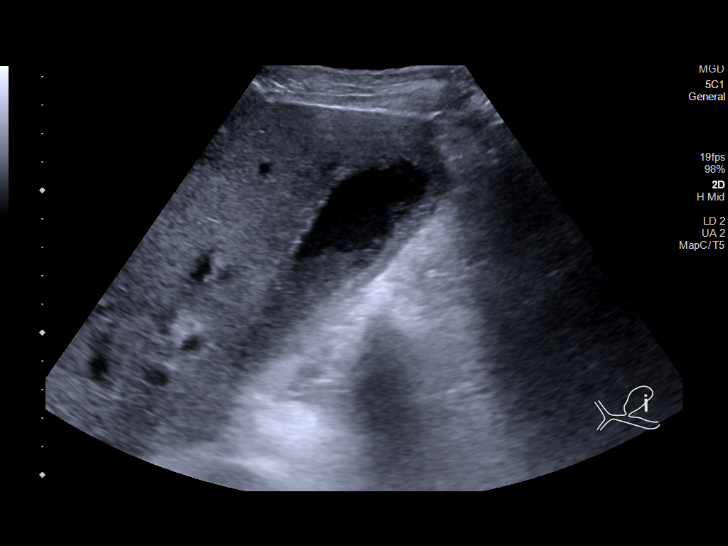
[im 28/75]
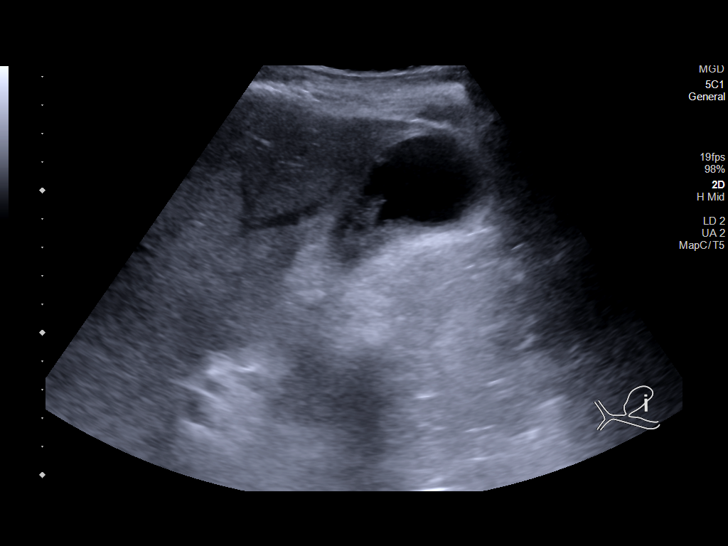
[im 34/75]
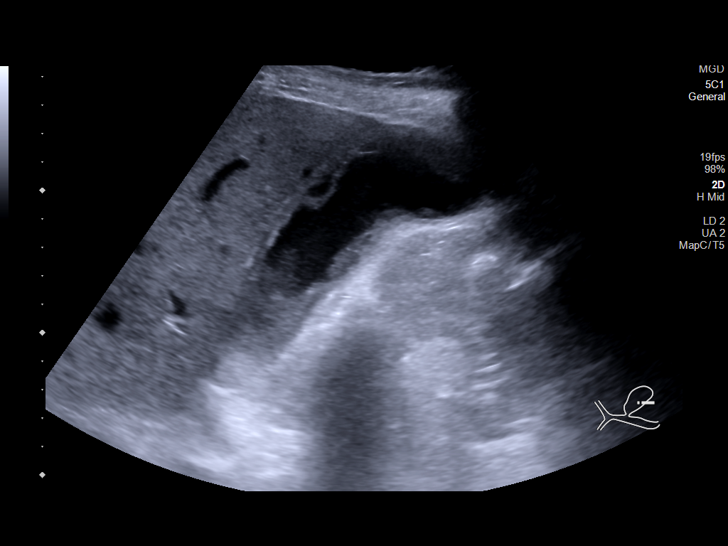
[im 41/75]
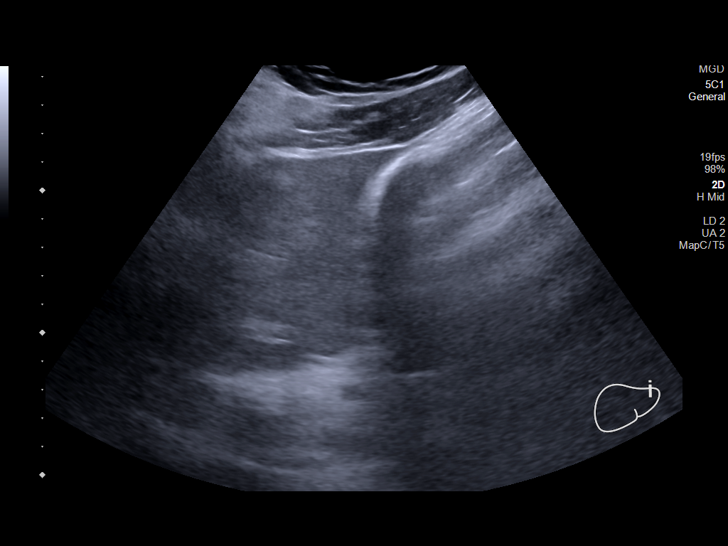
[im 47/75]
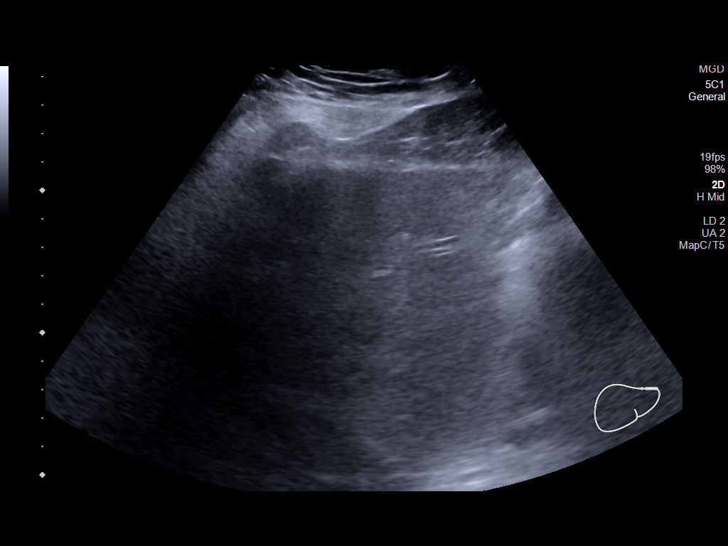
[im 50/75]
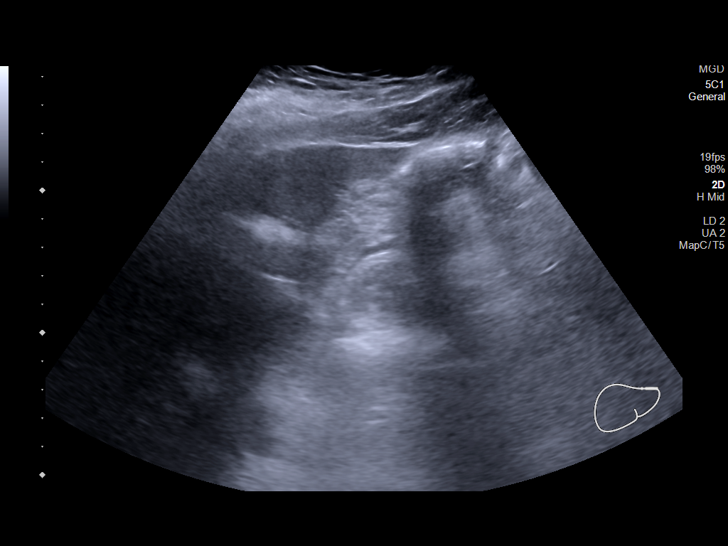
[im 56/75]
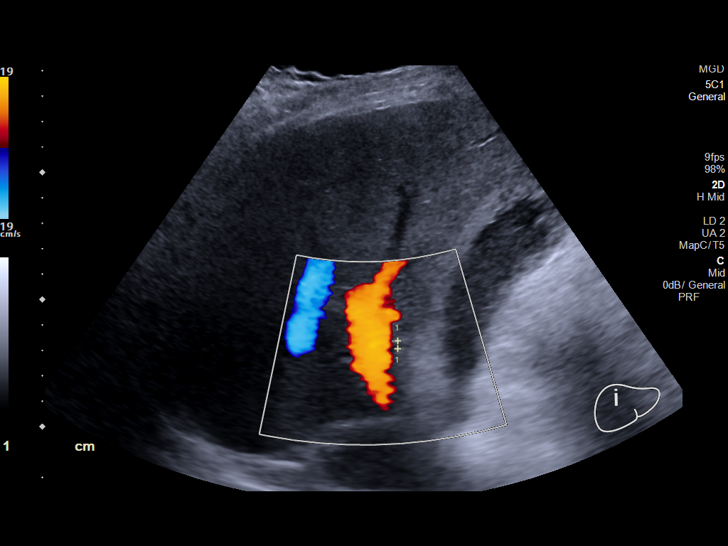
[im 62/75]
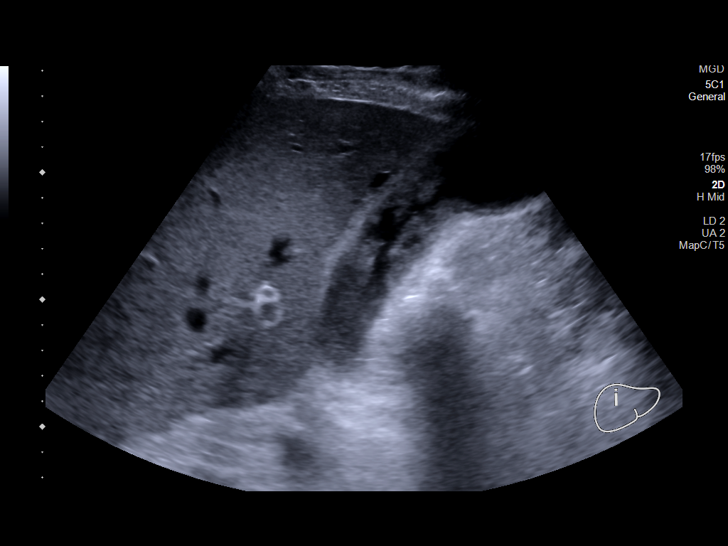
[im 68/75]
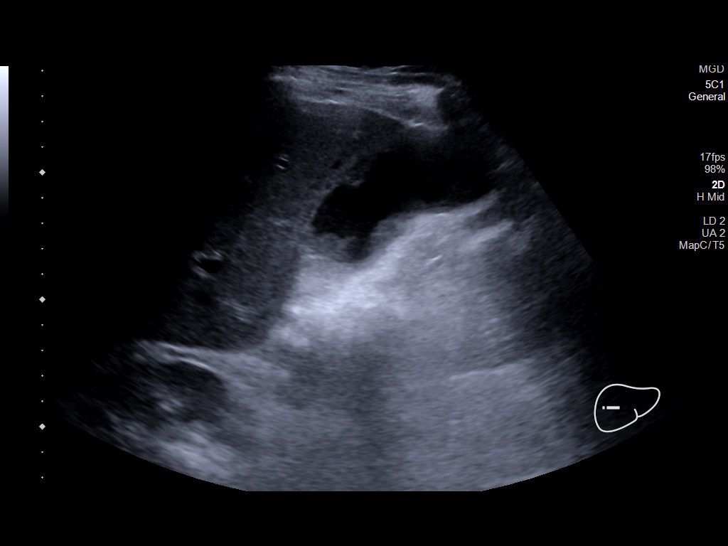
[im 75/75]
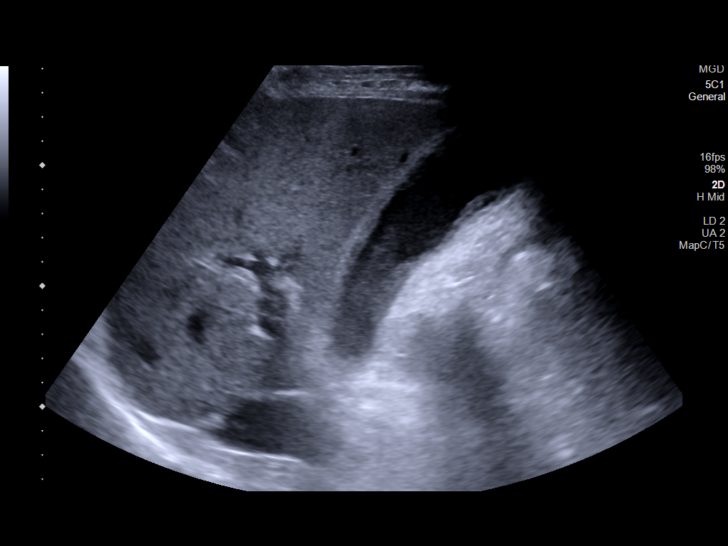

[14 of 25 positions shown; findings below may reference images not displayed]

FINDINGS: Gallbladder:

There is layering gallbladder sludge. The gallbladder wall is
thickened to 6 mm.

Common bile duct:

Diameter: 3 mm

Liver:

No focal lesion identified. Within normal limits in parenchymal
echogenicity. Portal vein is patent on color Doppler imaging with
normal direction of blood flow towards the liver.

Other: None.
IMPRESSION: Layering gallbladder sludge with abnormal thickening and hyperemia
of the gallbladder wall. In the correct clinical settings these
findings are supportive of acute cholecystitis.

## 2021-11-14 MED ORDER — SODIUM CHLORIDE 0.9 % IV BOLUS
1000.0000 mL | Freq: Once | INTRAVENOUS | Status: AC
  Administered 2021-11-14: 1000 mL via INTRAVENOUS

## 2021-11-14 MED ORDER — ONDANSETRON HCL 4 MG/2ML IJ SOLN
4.0000 mg | Freq: Once | INTRAMUSCULAR | Status: AC
  Administered 2021-11-14: 4 mg via INTRAVENOUS
  Filled 2021-11-14: qty 2

## 2021-11-14 MED ORDER — ONDANSETRON HCL 4 MG/2ML IJ SOLN
4.0000 mg | Freq: Four times a day (QID) | INTRAMUSCULAR | Status: DC | PRN
Start: 2021-11-14 — End: 2021-11-15
  Administered 2021-11-14: 4 mg via INTRAVENOUS
  Filled 2021-11-14: qty 2

## 2021-11-14 MED ORDER — SODIUM CHLORIDE 0.9 % IV SOLN: Freq: Once | INTRAVENOUS | Status: AC

## 2021-11-14 MED ORDER — SODIUM CHLORIDE 0.9 % IV SOLN
2.0000 g | Freq: Once | INTRAVENOUS | Status: AC
  Administered 2021-11-14: 2 g via INTRAVENOUS
  Filled 2021-11-14: qty 20

## 2021-11-14 MED ORDER — ONDANSETRON 4 MG PO TBDP
4.0000 mg | ORAL_TABLET | Freq: Once | ORAL | Status: AC
  Administered 2021-11-14: 4 mg via ORAL
  Filled 2021-11-14: qty 1

## 2021-11-14 MED ORDER — HYDROMORPHONE HCL 1 MG/ML IJ SOLN
1.0000 mg | INTRAMUSCULAR | Status: DC | PRN
  Administered 2021-11-14: 1 mg via INTRAVENOUS
  Filled 2021-11-14: qty 1

## 2021-11-14 MED ORDER — IOHEXOL 300 MG/ML  SOLN
100.0000 mL | Freq: Once | INTRAMUSCULAR | Status: AC | PRN
  Administered 2021-11-14: 100 mL via INTRAVENOUS

## 2021-11-14 MED ORDER — HYDROMORPHONE HCL 1 MG/ML IJ SOLN
1.0000 mg | Freq: Once | INTRAMUSCULAR | Status: AC
  Administered 2021-11-14: 1 mg via INTRAVENOUS
  Filled 2021-11-14: qty 1

## 2021-11-14 NOTE — Progress Notes (Signed)
Transferring facility: [facility] ?Requesting provider: Dr. Donnald Garre (EDP at [facility]) ?Reason for transfer: admission for further evaluation and management of suspected acute cholecystitis. ? ?46 year old male with medical history notable for former narcotic abuse, who presented to [facility] ED on 11/14/2021 complaining of 1 week of progressive right upper quadrant abdominal discomfort associated with nausea, with significant worsening of associated intensity of this abdominal discomfort over the last 1 day.  Notes associated chills in the absence of objective fever. ? ?Vital signs in the ED were notable for the following: Afebrile, normotensive, heart rates in the 60s to 70s, O2 sats of 99 to 100% on room air. ? ?Labs were notable for CMP demonstrating elevated liver enzymes in the absence of any elevation of total bilirubin;  lipase 27.  CBC notable for white blood cell count 9800. ? ?Imaging notable for abdominal ultrasound, which showed evidence of bladder wall thickening concerning for acute cholecystitis, without evidence of common bile duct dilation or choledocholithiasis.  CT abdomen/pelvis also showed no evidence of common bile duct dilation nor overt choledocholithiasis. ? ?EDP discussed the patient's case with the on-call general surgeon, Dr. Carolynne Edouard, Who recommended admission to the hospitalist service at [facility] long where he will formally consult and see the patient in the morning.  Additionally, EDP discussed the patient's case with on-call [facility] gastroenterology, Dr. Levora Angel, Who will also formally consult in the morning. ? ?Subsequently, I accepted this patient for transfer for inpatient admission to a MedSurg bed at [facility] for further work-up and management of suspected acute cholecystitis.  ? ? ? ? ?Check [email or web address] for on-call coverage. ?  ?Nursing staff, Please call [facility] Admits & Consults System-Wide number on Amion as soon as patient's arrival, so appropriate admitting provider can  evaluate the pt. ? ? ? ? ?Newton Pigg, DO ?Hospitalist  ?

## 2021-11-14 NOTE — ED Triage Notes (Signed)
Pt arrives pov, slow gait to triage c/o upper abdominal pain with nausea. Recent hernia surgery early march 2023.  ?

## 2021-11-14 NOTE — ED Provider Notes (Addendum)
[facility] EMERGENCY DEPARTMENT Provider Note   CSN: [id number] Arrival date & time: 11/14/21  1646     History  Chief Complaint  Patient presents with   Abdominal Pain    Chris Hamilton is a 46 y.o. male.  HPI Patient reports he started getting pain in his right upper abdomen about a week ago.  Was gradual in onset.  He reports it was uncomfortable but not severely painful.  Over the past day however pain became much more intense and difficult to localize.  He reports now it feels very painful just throughout his whole central upper abdomen.  He reports he felt extremely nauseated today and made himself throw up thinking it might make him feel better.  Did not help.  He denies he had diarrhea or constipation.  No fever that he is aware of but he notes he has not been able to take his temperature.  He does feel like he has had some chills and been achy.  He reports a deep breath makes it hurt a lot more in the abdomen.  He denies similar pain but does identify prior repair of umbilical hernia almost 2 months ago that had central abdominal pain.  Right now however he does not feel that it necessarily localizes to the umbilical hernia area.  No pain burning with urination.  He has not noted his urine to be dark or bloody  Patient denies history of alcohol use or abuse.  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  He reports he vapes and uses kratom but no IV drug use or other drug use for a year and a half.  Patient denies any known prior history of hepatitis or HIV.    Home Medications Prior to Admission medications   Medication Sig Start Date End Date Taking? Authorizing Provider  acetaminophen (TYLENOL) 500 MG tablet Take 2 tablets (1,000 mg total) by mouth every 6 (six) hours. Take scheduled for the first 5 days after surgery, then switch to taking as needed. 09/22/21   Chris Bue, MD  ibuprofen (ADVIL) 800 MG tablet Take 1  tablet (800 mg total) by mouth every 8 (eight) hours as needed. 09/22/21   Chris Bue, MD  methocarbamol (ROBAXIN) 500 MG tablet Take 1 tablet (500 mg total) by mouth every 6 (six) hours as needed for muscle spasms (pain). 09/22/21   Chris Bue, MD  oxyCODONE (ROXICODONE) 5 MG immediate release tablet Take 1 tablet (5 mg total) by mouth every 8 (eight) hours as needed. Alternate tylenol and ibuprofen for the first few days. Take narcotic pain medication only if needed for severe/ breakthrough pain. 09/22/21 09/22/22  Chris Bue, MD      Allergies    Patient has no known allergies.    Review of Systems   Review of Systems 10 Systems reviewed and negative except as per HPI Physical Exam Updated Vital Signs BP (!) 136/92   Pulse 71   Temp 97.6 F (36.4 C) (Oral)   Resp 16   Ht 6\' 2"  (1.88 m)   Wt 122.5 kg   SpO2 99%   BMI 34.67 kg/m  Physical Exam Constitutional:      Comments: Alert nontoxic.  No respiratory distress.  HENT:     Mouth/Throat:     Pharynx: Oropharynx is clear.  Eyes:     Extraocular Movements: Extraocular movements intact.     Conjunctiva/sclera: Conjunctivae normal.  Cardiovascular:     Rate and Rhythm: Normal rate and regular rhythm.  Pulmonary:     Effort: Pulmonary effort is normal.     Breath sounds: Normal breath sounds.  Abdominal:     Comments: Moderate to severe right upper quadrant tenderness.  Patient does have indurated scar tissue around the umbilicus however it is not erythematous or tender.  Musculoskeletal:        General: No swelling or tenderness. Normal range of motion.     Right lower leg: No edema.     Left lower leg: No edema.  Skin:    General: Skin is warm and dry.  Neurological:     General: No focal deficit present.     Mental Status: He is oriented to person, place, and time.     Motor: No weakness.     Coordination: Coordination normal.    ED Results / Procedures / Treatments   Labs (all labs ordered are  listed, but only abnormal results are displayed) Labs Reviewed  BASIC METABOLIC PANEL - Abnormal; Notable for the following components:      Result Value   Glucose, Bld 127 (*)    Calcium 8.7 (*)    All other components within normal limits  CBC - Abnormal; Notable for the following components:   Hemoglobin 12.9 (*)    All other components within normal limits  HEPATIC FUNCTION PANEL - Abnormal; Notable for the following components:   AST 236 (*)    ALT 227 (*)    Alkaline Phosphatase 332 (*)    Bilirubin, Direct 0.5 (*)    All other components within normal limits  URINALYSIS, ROUTINE W REFLEX MICROSCOPIC - Abnormal; Notable for the following components:   APPearance CLOUDY (*)    pH 8.5 (*)    Bilirubin Urine SMALL (*)    Protein, ur 30 (*)    All other components within normal limits  URINALYSIS, MICROSCOPIC (REFLEX) - Abnormal; Notable for the following components:   Bacteria, UA FEW (*)    All other components within normal limits  LIPASE, BLOOD  HEPATITIS PANEL, ACUTE  RAPID HIV SCREEN (HIV 1/2 AB+AG)  TROPONIN I (HIGH SENSITIVITY)    EKG EKG Interpretation  Date/Time:  Sunday November 14 2021 17:25:59 EDT Ventricular Rate:  68 PR Interval:  198 QRS Duration: 98 QT Interval:  404 QTC Calculation: 429 R Axis:   112 Text Interpretation: Normal sinus rhythm Right axis deviation no sig change from previous When compared with ECG of 22-Sep-2021 07:22, PREVIOUS ECG IS PRESENT Confirmed by Arby Barrette [phone]) on 11/14/2021 10:40:04 PM  Radiology CT Abdomen Pelvis W Contrast  Result Date: 11/14/2021 CLINICAL DATA:  Abdominal pain, nausea, recent umbilical hernia surgery EXAM: CT ABDOMEN AND PELVIS WITH CONTRAST TECHNIQUE: Multidetector CT imaging of the abdomen and pelvis was performed using the standard protocol following bolus administration of intravenous contrast. RADIATION DOSE REDUCTION: This exam was performed according to the departmental dose-optimization program  which includes automated exposure control, adjustment of the mA and/or kV according to patient size and/or use of iterative reconstruction technique. CONTRAST:  OMNIPAQUE IOHEXOL 300 MG/ML  SOLN COMPARISON:  None. FINDINGS: Lower chest: Lung bases are clear. Hepatobiliary: Liver is within normal limits. Gallbladder is unremarkable. No intrahepatic or extrahepatic ductal dilatation. Pancreas: Within normal limits. Spleen: Within normal limits. Adrenals/Urinary Tract: Adrenal glands are within normal limits. Kidneys are within normal limits.  No hydronephrosis. Bladder is within normal limits. Stomach/Bowel: Stomach is within normal limits. No  evidence of bowel obstruction. Normal appendix (series 2/image 65). No colonic wall thickening or inflammatory changes. Vascular/Lymphatic: No evidence of abdominal aortic aneurysm. Small upper abdominal lymph nodes measuring up to 12 mm short axis (series 2/image 25), likely reactive. Reproductive: Prostate is unremarkable. Other: No abdominopelvic ascites. Postsurgical changes related to prior umbilical hernia repair. Associated cutaneous thickening with phlegmonous change/stranding (series 2/images 62-63), suspicious for cellulitis, with subcutaneous abscess considered less likely. Musculoskeletal: Mild degenerative changes at L5-S1. IMPRESSION: Phlegmonous changes in the periumbilical region favors cellulitis, with subcutaneous abscess considered less likely. No evidence of bowel obstruction. Electronically Signed   By: Charline Bills M.D.   On: 11/14/2021 20:49   US Abdomen Limited RUQ (LIVER/GB)  Result Date: 11/14/2021 CLINICAL DATA:  Right upper quadrant pain. EXAM: ULTRASOUND ABDOMEN LIMITED RIGHT UPPER QUADRANT COMPARISON:  None. FINDINGS: Gallbladder: There is layering gallbladder sludge. The gallbladder wall is thickened to 6 mm. Common bile duct: Diameter: 3 mm Liver: No focal lesion identified. Within normal limits in parenchymal echogenicity. Portal  vein is patent on color Doppler imaging with normal direction of blood flow towards the liver. Other: None. IMPRESSION: Layering gallbladder sludge with abnormal thickening and hyperemia of the gallbladder wall. In the correct clinical settings these findings are supportive of acute cholecystitis. Electronically Signed   By: Ted Mcalpine M.D.   On: 11/14/2021 21:00    Procedures Procedures    Medications Ordered in ED Medications  cefTRIAXone (ROCEPHIN) 2 g in sodium chloride 0.9 % 100 mL IVPB (has no administration in time range)  ondansetron (ZOFRAN-ODT) disintegrating tablet 4 mg (4 mg Oral Given 11/14/21 1722)  sodium chloride 0.9 % bolus 1,000 mL (0 mLs Intravenous Stopped 11/14/21 2234)  0.9 %  sodium chloride infusion ( Intravenous New Bag/Given 11/14/21 2237)  ondansetron (ZOFRAN) injection 4 mg (4 mg Intravenous Given 11/14/21 2008)  HYDROmorphone (DILAUDID) injection 1 mg (1 mg Intravenous Given 11/14/21 2008)  iohexol (OMNIPAQUE) 300 MG/ML solution 100 mL (100 mLs Intravenous Contrast Given 11/14/21 2027)    ED Course/ Medical Decision Making/ A&P                           Medical Decision Making Amount and/or Complexity of Data Reviewed Labs: ordered. Radiology: ordered.  Risk Prescription drug management. Decision regarding hospitalization.  Patient presents with 1 week of gradual right upper quadrant pain.  He does have reproducible pain and just over the past 1 to 2 days pain has become much more intense and difficult to localize in the upper central abdomen.  We will need to proceed with broad diagnostic evaluation with labs and CT scan.  There is significant right upper quadrant tenderness and elevated LFTs thus we will also obtain ultrasound.  Patient be treated for pain with Dilaudid 1 mg IV, Zofran and fluid resuscitation.  Ultrasound shows edematous gallbladder consistent with cholecystitis.  This is consistent with patient's physical exam for significant right  upper quadrant reproducible pain.  Will initiate Rocephin for cholecystitis.  Patient has prior use of IV drug abuse but is not having any active IV drug abuse.  He is in recovery.  He does use Kratom.  I will also add hepatitis and HIV studies.   Consult: Reviewed with Dr. Carolynne Edouard.  General surgery.  At this time with symptoms being consistent with cholecystitis and possible hepatitis, recommends admission to medical service with consultation to GI.  Surgery can then be consulted as well for cholecystectomy.  Consult:  Dr. Levora Angel for GI.  Will see in consult.  Consult: Dr. Arlean Hopping [facility] for admission.      Final Clinical Impression(s) / ED Diagnoses Final diagnoses:  Cholecystitis    Rx / DC Orders ED Discharge Orders     None         Arby Barrette, MD 11/14/21 2249    Arby Barrette, MD 11/14/21 2326

## 2021-11-15 ENCOUNTER — Inpatient Hospital Stay (HOSPITAL_COMMUNITY): Payer: BC Managed Care – PPO

## 2021-11-15 ENCOUNTER — Encounter (HOSPITAL_COMMUNITY): Payer: Self-pay | Admitting: Internal Medicine

## 2021-11-15 DIAGNOSIS — E669 Obesity, unspecified: Secondary | ICD-10-CM

## 2021-11-15 DIAGNOSIS — K810 Acute cholecystitis: Secondary | ICD-10-CM

## 2021-11-15 DIAGNOSIS — R935 Abnormal findings on diagnostic imaging of other abdominal regions, including retroperitoneum: Secondary | ICD-10-CM | POA: Diagnosis not present

## 2021-11-15 DIAGNOSIS — F1911 Other psychoactive substance abuse, in remission: Secondary | ICD-10-CM | POA: Diagnosis present

## 2021-11-15 DIAGNOSIS — K660 Peritoneal adhesions (postprocedural) (postinfection): Secondary | ICD-10-CM | POA: Diagnosis present

## 2021-11-15 DIAGNOSIS — F17290 Nicotine dependence, other tobacco product, uncomplicated: Secondary | ICD-10-CM | POA: Diagnosis present

## 2021-11-15 DIAGNOSIS — R748 Abnormal levels of other serum enzymes: Secondary | ICD-10-CM | POA: Diagnosis present

## 2021-11-15 DIAGNOSIS — Z6834 Body mass index (BMI) 34.0-34.9, adult: Secondary | ICD-10-CM | POA: Diagnosis not present

## 2021-11-15 DIAGNOSIS — R7989 Other specified abnormal findings of blood chemistry: Secondary | ICD-10-CM | POA: Diagnosis present

## 2021-11-15 DIAGNOSIS — B182 Chronic viral hepatitis C: Secondary | ICD-10-CM | POA: Diagnosis present

## 2021-11-15 DIAGNOSIS — K8012 Calculus of gallbladder with acute and chronic cholecystitis without obstruction: Secondary | ICD-10-CM | POA: Diagnosis present

## 2021-11-15 DIAGNOSIS — K5900 Constipation, unspecified: Secondary | ICD-10-CM | POA: Diagnosis present

## 2021-11-15 LAB — COMPREHENSIVE METABOLIC PANEL
ALT: 482 U/L — ABNORMAL HIGH (ref 0–44)
AST: 329 U/L — ABNORMAL HIGH (ref 15–41)
Albumin: 3.6 g/dL (ref 3.5–5.0)
Alkaline Phosphatase: 353 U/L — ABNORMAL HIGH (ref 38–126)
Anion gap: 6 (ref 5–15)
BUN: 11 mg/dL (ref 6–20)
CO2: 23 mmol/L (ref 22–32)
Calcium: 8.3 mg/dL — ABNORMAL LOW (ref 8.9–10.3)
Chloride: 109 mmol/L (ref 98–111)
Creatinine, Ser: 0.52 mg/dL — ABNORMAL LOW (ref 0.61–1.24)
GFR, Estimated: >60 mL/min (ref >60)
Glucose, Bld: 125 mg/dL — ABNORMAL HIGH (ref 70–99)
Potassium: 4 mmol/L (ref 3.5–5.1)
Sodium: 138 mmol/L (ref 135–145)
Total Bilirubin: 2.3 mg/dL — ABNORMAL HIGH (ref 0.3–1.2)
Total Protein: 7 g/dL (ref 6.5–8.1)

## 2021-11-15 LAB — CBC WITH DIFFERENTIAL/PLATELET
Abs Immature Granulocytes: 0.03 10*3/uL (ref 0.00–0.07)
Basophils Absolute: 0 10*3/uL (ref 0.0–0.1)
Basophils Relative: 0 %
Eosinophils Absolute: 0 10*3/uL (ref 0.0–0.5)
Eosinophils Relative: 0 %
HCT: 36.8 % — ABNORMAL LOW (ref 39.0–52.0)
Hemoglobin: 12.2 g/dL — ABNORMAL LOW (ref 13.0–17.0)
Immature Granulocytes: 0 %
Lymphocytes Relative: 13 %
Lymphs Abs: 1.3 10*3/uL (ref 0.7–4.0)
MCH: 28.2 pg (ref 26.0–34.0)
MCHC: 33.2 g/dL (ref 30.0–36.0)
MCV: 85.2 fL (ref 80.0–100.0)
Monocytes Absolute: 0.6 10*3/uL (ref 0.1–1.0)
Monocytes Relative: 6 %
Neutro Abs: 7.9 10*3/uL — ABNORMAL HIGH (ref 1.7–7.7)
Neutrophils Relative %: 81 %
Platelets: 176 10*3/uL (ref 150–400)
RBC: 4.32 MIL/uL (ref 4.22–5.81)
RDW: 12.4 % (ref 11.5–15.5)
WBC: 9.8 10*3/uL (ref 4.0–10.5)
nRBC: 0 % (ref 0.0–0.2)

## 2021-11-15 LAB — RAPID URINE DRUG SCREEN, HOSP PERFORMED
Amphetamines: NOT DETECTED
Barbiturates: NOT DETECTED
Benzodiazepines: NOT DETECTED
Cocaine: NOT DETECTED
Opiates: NOT DETECTED
Tetrahydrocannabinol: NOT DETECTED

## 2021-11-15 LAB — PROTIME-INR
INR: 1 (ref 0.8–1.2)
Prothrombin Time: 13.3 seconds (ref 11.4–15.2)

## 2021-11-15 LAB — APTT: aPTT: 33 seconds (ref 24–36)

## 2021-11-15 LAB — BRAIN NATRIURETIC PEPTIDE: B Natriuretic Peptide: 112.6 pg/mL — ABNORMAL HIGH (ref 0.0–100.0)

## 2021-11-15 LAB — CK: Total CK: 118 U/L (ref 49–397)

## 2021-11-15 IMAGING — MR MR 3D RECON AT SCANNER
19 of 22 series · 44 of 48 positions shown · IV contrast (gadavist)
Comparison: CT abdomen and pelvis dated [DATE]

CLINICAL DATA: Abdominal pain

EXAM:
MRI ABDOMEN WITHOUT AND WITH CONTRAST (INCLUDING MRCP)
TECHNIQUE: Multiplanar multisequence MR imaging of the abdomen was performed
both before and after the administration of intravenous contrast.
Heavily T2-weighted images of the biliary and pancreatic ducts were
obtained, and three-dimensional MRCP images were rendered by post
processing.
CONTRAST:  10mL GADAVIST GADOBUTROL 1 MMOL/ML IV SOLN

[Series 2: DWI · axial · 6.0mm · 1.68mm/px · z∈[-141,+183]mm · 2 of 92 slices shown (1 of 2)]
[im 1/92]
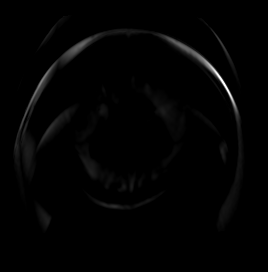
[im 92/92]
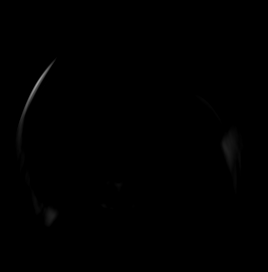

[Series 3: DWI · axial · 6.0mm · 1.68mm/px · 1 of 46 slices shown (2 of 2)]
[im 1/46]
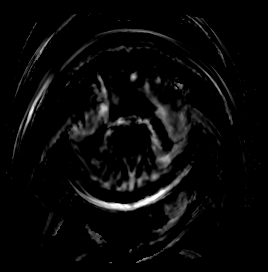

[Series 4: T2 fat-sat · axial · 6.0mm · 1.41mm/px · 1 of 46 slices shown]
[im 1/46]
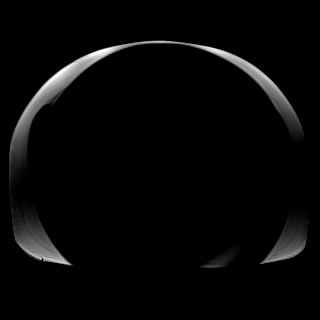

[Series 7: cor_3d_spc_trig · coronal · 1.0mm · 0.59mm/px · 2 of 72 slices shown]
[im 1/72]
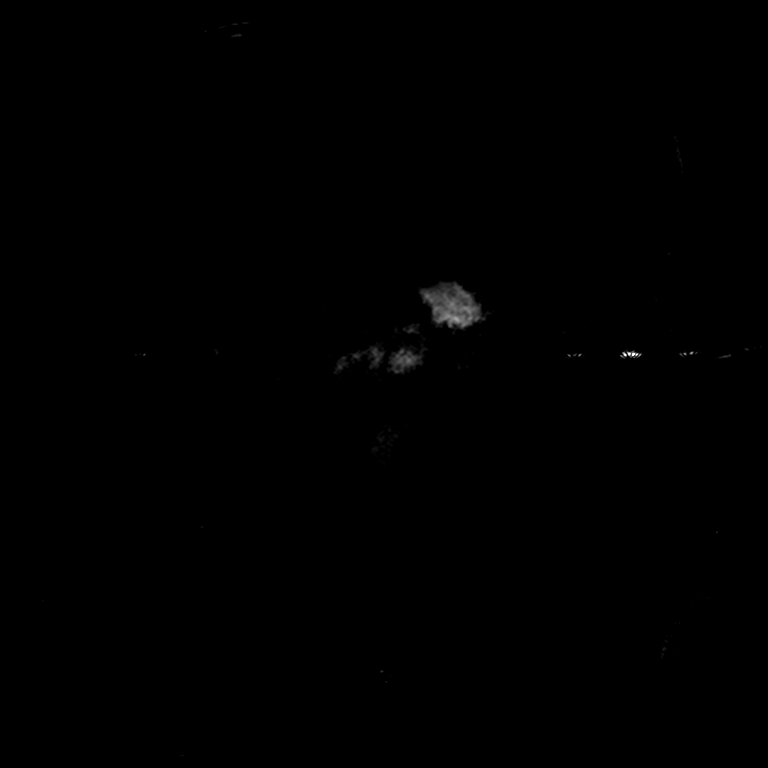
[im 72/72]
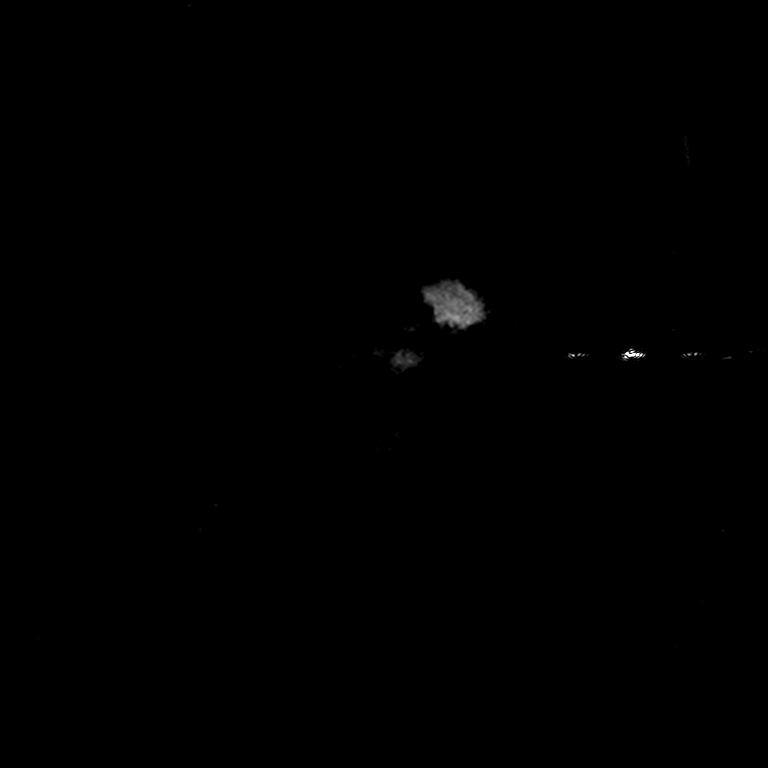

[Series 10: T2 · coronal · 6.0mm · 1.76mm/px · 1 of 47 slices shown (1 of 2)]
[im 1/47]
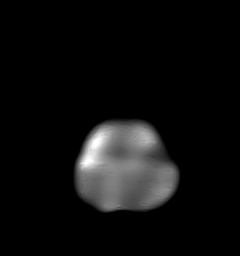

[Series 11: T1 · axial · 3.5mm · 1.56mm/px · z∈[-162,+142]mm · 3 of 88 slices shown (1 of 2)]
[im 1/88]
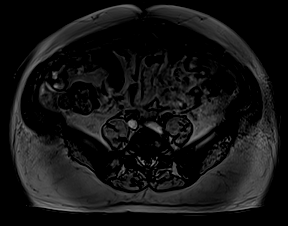
[im 44/88]
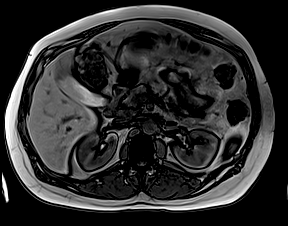
[im 88/88]
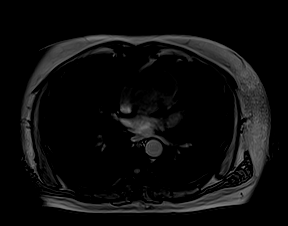

[Series 12: T1 · axial · 3.5mm · 1.56mm/px · z∈[-162,+142]mm · 3 of 88 slices shown (2 of 2)]
[im 1/88]
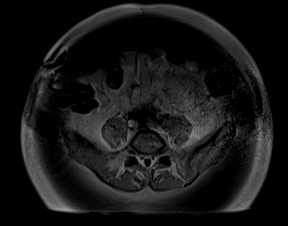
[im 44/88]
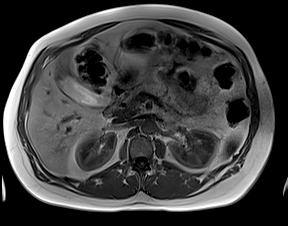
[im 88/88]
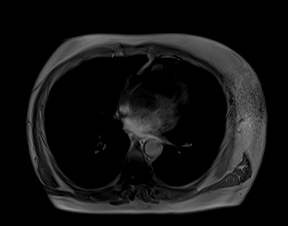

[Series 13: cor obl thk · sagittal · 50.0mm · 0.78mm/px · 1 of 9 slices shown]
[im 1/9]
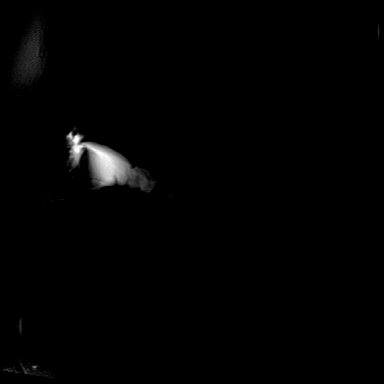

[Series 15: T2 · axial · 6.0mm · 1.76mm/px · 1 of 42 slices shown (2 of 2)]
[im 1/42]
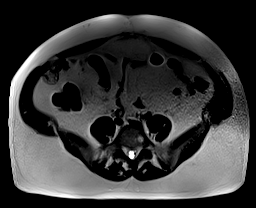

[Series 17: T1 dynamic · axial · 4.0mm · 1.41mm/px · z∈[-181,+135]mm · 3 of 80 slices shown (1 of 10)]
[im 1/80]
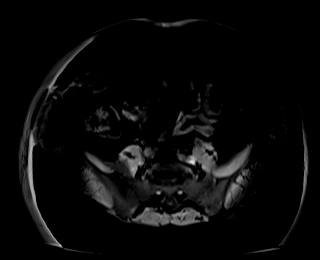
[im 40/80]
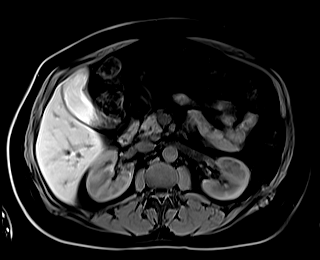
[im 80/80]
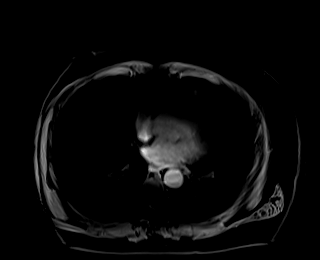

[Series 21: T1 dynamic · axial · 4.0mm · 1.41mm/px · z∈[-181,+135]mm · 3 of 80 slices shown (2 of 10)]
[im 1/80]
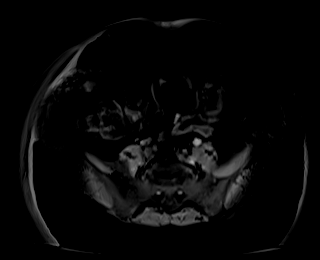
[im 40/80]
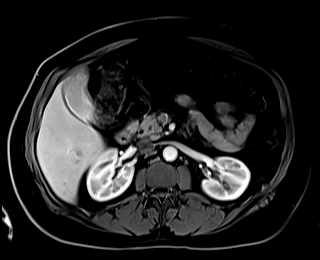
[im 80/80]
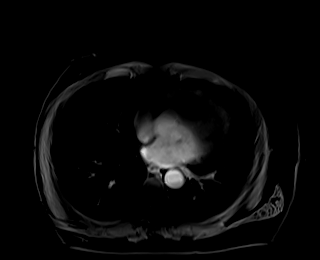

[Series 22: T1 dynamic · axial · 4.0mm · 1.41mm/px · z∈[-181,+135]mm · 3 of 80 slices shown (3 of 10)]
[im 1/80]
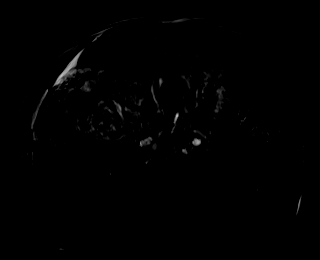
[im 40/80]
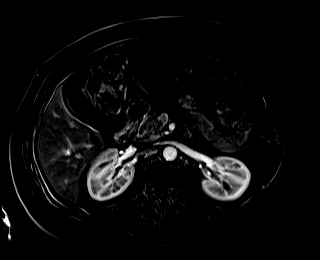
[im 80/80]
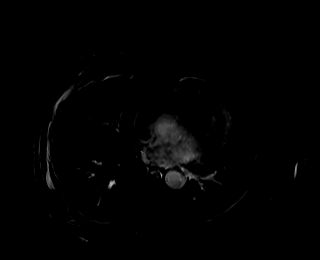

[Series 26: T1 dynamic · axial · 4.0mm · 1.41mm/px · z∈[-181,+135]mm · 3 of 80 slices shown (4 of 10)]
[im 1/80]
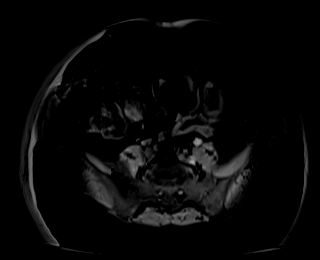
[im 40/80]
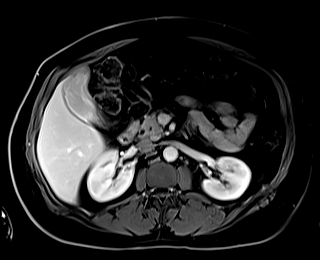
[im 80/80]
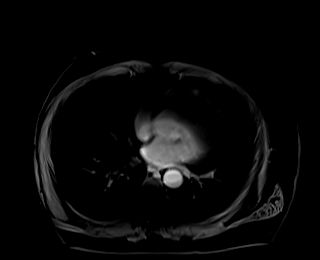

[Series 27: T1 dynamic · axial · 4.0mm · 1.41mm/px · z∈[-181,+135]mm · 3 of 80 slices shown (5 of 10)]
[im 1/80]
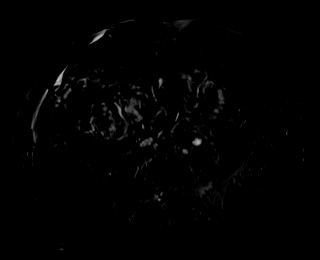
[im 40/80]
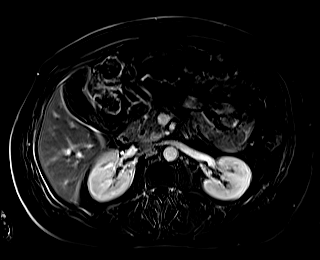
[im 80/80]
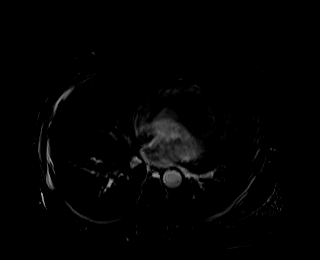

[Series 30: T1 dynamic · axial · 4.0mm · 1.41mm/px · z∈[-181,+135]mm · 3 of 80 slices shown (6 of 10)]
[im 1/80]
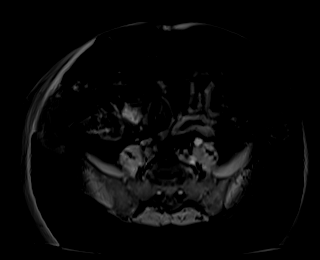
[im 40/80]
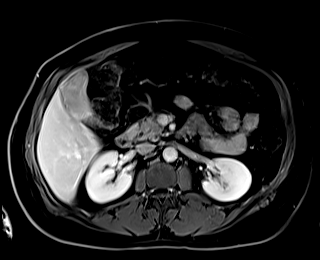
[im 80/80]
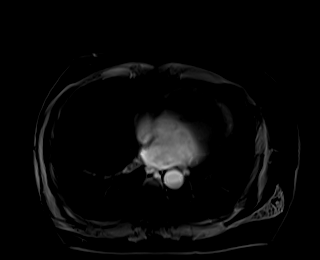

[Series 31: T1 dynamic · axial · 4.0mm · 1.41mm/px · z∈[-181,+135]mm · 3 of 80 slices shown (7 of 10)]
[im 1/80]
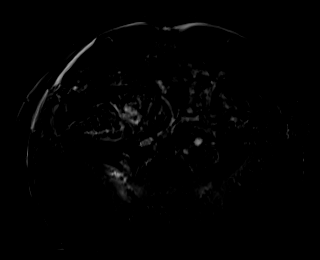
[im 40/80]
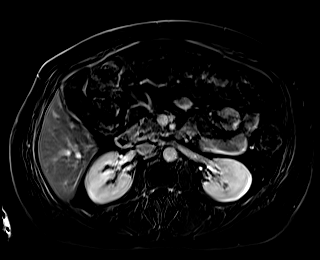
[im 80/80]
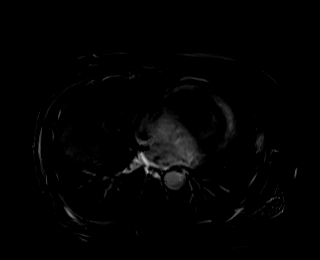

[Series 33: T1 dynamic · coronal · 5.0mm · 1.56mm/px · 2 of 64 slices shown (8 of 10)]
[im 1/64]
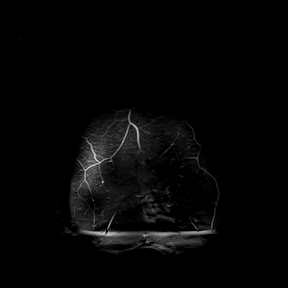
[im 64/64]
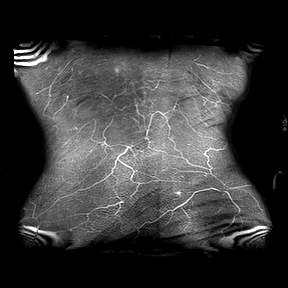

[Series 36: T1 dynamic · axial · 4.0mm · 1.41mm/px · z∈[-181,+135]mm · 3 of 80 slices shown (9 of 10)]
[im 1/80]
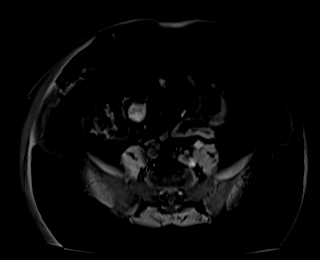
[im 40/80]
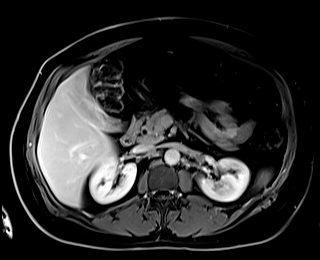
[im 80/80]
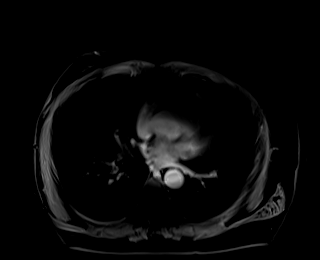

[Series 37: T1 dynamic · axial · 4.0mm · 1.41mm/px · z∈[-181,+135]mm · 3 of 80 slices shown (10 of 10)]
[im 1/80]
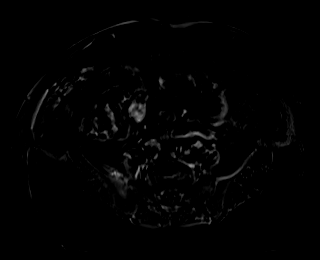
[im 40/80]
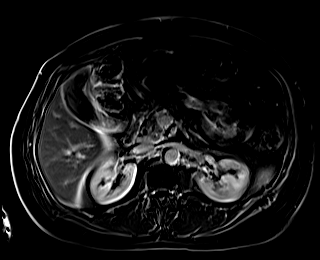
[im 80/80]
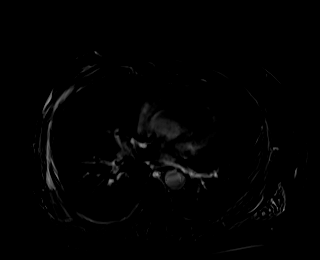

[44 of 48 positions shown; findings below may reference images not displayed]

FINDINGS: Lower chest: No acute findings.

Hepatobiliary: No mass or other parenchymal abnormality
identified.Nondistended gallbladder with irregular wall thickening
and mucosal hyperenhancement. No biliary ductal dilation.

Pancreas: No mass, inflammatory changes, or other parenchymal
abnormality identified.

Spleen:  Within normal limits in size and appearance.

Adrenals/Urinary Tract: No masses identified. No evidence of
hydronephrosis.

Stomach/Bowel: Visualized portions within the abdomen are
unremarkable.

Vascular/Lymphatic: Mildly enlarged periportal lymph nodes which are
likely reactive. Reference node measuring 1.0 cm in short axis on
series 1, image 31. No abdominal aortic aneurysm demonstrated.

Other:  None.

Musculoskeletal: No suspicious bone lesions identified.
IMPRESSION: Nondistended gallbladder with irregular wall thickening and mucosal
hyperenhancement, findings are nonspecific and could be due to acute
cholecystitis or secondary to systemic process such as
hepatocellular disease. Recommend HIDA scan for further evaluation.

## 2021-11-15 MED ORDER — ACETAMINOPHEN 650 MG RE SUPP: 650.0000 mg | Freq: Four times a day (QID) | RECTAL | Status: DC | PRN

## 2021-11-15 MED ORDER — HYDROMORPHONE HCL 1 MG/ML IJ SOLN: 0.5000 mg | INTRAMUSCULAR | Status: DC | PRN

## 2021-11-15 MED ORDER — GADOBUTROL 1 MMOL/ML IV SOLN
10.0000 mL | Freq: Once | INTRAVENOUS | Status: AC | PRN
  Administered 2021-11-15: 10 mL via INTRAVENOUS

## 2021-11-15 MED ORDER — ONDANSETRON HCL 4 MG/2ML IJ SOLN: 4.0000 mg | Freq: Four times a day (QID) | INTRAMUSCULAR | Status: DC | PRN

## 2021-11-15 MED ORDER — ONDANSETRON HCL 4 MG PO TABS: 4.0000 mg | ORAL_TABLET | Freq: Four times a day (QID) | ORAL | Status: DC | PRN

## 2021-11-15 MED ORDER — HYDROMORPHONE HCL 1 MG/ML IJ SOLN: 1.0000 mg | INTRAMUSCULAR | Status: DC | PRN

## 2021-11-15 MED ORDER — LACTATED RINGERS IV SOLN: INTRAVENOUS | Status: AC

## 2021-11-15 MED ORDER — SODIUM CHLORIDE 0.9 % IV SOLN
2.0000 g | INTRAVENOUS | Status: DC
  Administered 2021-11-15: 2 g via INTRAVENOUS
  Filled 2021-11-15: qty 20

## 2021-11-15 MED ORDER — ACETAMINOPHEN 325 MG PO TABS: 650.0000 mg | ORAL_TABLET | Freq: Four times a day (QID) | ORAL | Status: DC | PRN

## 2021-11-15 MED ORDER — POLYETHYLENE GLYCOL 3350 17 G PO PACK: 17.0000 g | PACK | Freq: Every day | ORAL | Status: DC | PRN

## 2021-11-15 NOTE — ED Notes (Signed)
Report given to [facility] (David) 

## 2021-11-15 NOTE — Assessment & Plan Note (Addendum)
Placing the patient at high risk of poor outcome. ?Body mass index is 34.67 kg/m?Marland Kitchen ?

## 2021-11-15 NOTE — Progress Notes (Signed)
?  Progress Note ?Patient: Chris Hamilton:[id number] DOB: October 04, 1975 DOA: 11/14/2021  ?DOS: the patient was seen and examined on 11/15/2021 ? ?Brief hospital course: ?46 year old male with past medical history of intravenous drug abuse (heroin, fentanyl, methamphetamine see [facility] admission in 2021) and recent open repair of incarcerated 2.5 cm umbilical hernia on 09/22/2021 performed by Dr. Phylliss Blakes with general surgery presenting to med [facility] emergency department with complaints of abdominal pain. ?Found to have elevated LFTs with concern for acute cholecystitis. ?General surgery and GI both consulted. ? ?Assessment and Plan: ?* Acute cholecystitis ?Presents with 1 week complaint of abdominal pain. ?RUQ ultrasound concerning for gallbladder sludge and gallbladder thickening. ?CT abdomen also makes comments about possible cellulitis although clinically no evidence of cellulitis or infection at the surgical site around umbilicus. ?Currently on IV antibiotics. ?General surgery and GI both consulted. ?MRCP negative for any CBD stone but shows gallbladder wall thickening. ?LFT trending up. ?May require laparoscopic cholecystectomy. ?We will follow-up on recommendations. ?N.p.o. after midnight. ? ?Abnormal LFTs ?Notable elevation of AST, ALT, alkaline phosphatase and direct bilirubin on initial hepatic function panel ?Considering the patient's presentation and ultrasound findings this gives the appearance of a cholestatic picture ?Hepatitis panel sent. ?HIV negative. ?Less likely hepatitis given level of LFTs. ? ?History of drug abuse in remission [facility]) ?Patient reports being abstinent of all illicit drugs for 1-1/2 years ? ?Obesity (BMI 30-39.9) ?Placing the patient at high risk of poor outcome. ?Body mass index is 34.67 kg/m?. ? ?Subjective: No nausea no vomiting.  Continues to have some abdominal pain.  No fever no chills. ? ?Physical Exam: ?Vitals:  ? 11/15/21 0626 11/15/21 1015 11/15/21 1327  11/15/21 1748  ?BP: 102/80 121/88 125/86 (!) 137/91  ?Pulse: 95 80 78 77  ?Resp: 18 16 17 14   ?Temp: 97.9 ?F (36.6 ?C) 98.5 ?F (36.9 ?C) 99.1 ?F (37.3 ?C) 99.1 ?F (37.3 ?C)  ?TempSrc: Oral Oral Oral Oral  ?SpO2: 96% 97% 96% 98%  ?Weight:      ?Height:      ? ?General: Appear in mild distress; no visible Abnormal Neck Mass Or lumps, Conjunctiva normal ?Cardiovascular: S1 and S2 Present, no Murmur, ?Respiratory: good respiratory effort, Bilateral Air entry present and CTA, no Crackles, no wheezes ?Abdomen: Bowel Sound present, Non tender  ?Extremities: no Pedal edema ?Neurology: alert and oriented to time, place, and person ?Gait not checked due to patient safety concerns  ? ?Data Reviewed: ?I have Reviewed nursing notes, Vitals, and Lab results since pt's last encounter. Pertinent lab results CBC and CMP ?I have ordered test including CBC and CMP ?I have discussed pt's care plan and test results with GI and surgery.  ? ?Family Communication: Family at bedside.  Questions answered. ? ?Disposition: ?Status is: Inpatient ?Remains inpatient appropriate because: Need for further work-up and therapy for cholecystitis.  Most likely will require lap chole. ? ?Author: ? , MD ?11/15/2021 6:48 PM ? ?Please look on [email or web address] to find out who is on call. ?

## 2021-11-15 NOTE — Progress Notes (Signed)
Transition of Care (TOC) Screening Note ? ?Patient Details  ?Name: Chris Hamilton ?Date of Birth: Jan 20, 1976 ? ?Transition of Care (TOC) CM/SW Contact:    ?Ewing Schlein, LCSW ?Phone Number: ?11/15/2021, 10:39 AM ? ?Transition of Care Department [facility]) has reviewed patient and no TOC needs have been identified at this time. We will continue to monitor patient advancement through interdisciplinary progression rounds. If new patient transition needs arise, please place a TOC consult. ?

## 2021-11-15 NOTE — Consult Note (Signed)
? ? ? ? ?Consult Note ? ?Chris CoupChristopher M Hamilton ?1976-05-22  ?[id number].   ? ?Requesting MD: Rolly SalterPranav M Patel, MD ?Chief Complaint/Reason for Consult: Cholelithiasis with elevated LFTs ?HPI:  ?Patient is a 46 year old male who [facility] with RUQ abdominal pain x 1 week that became more epigastric and umbilical yesterday. He reports pain was gradual onset and initially was more just uncomfortable. Pain worsened over the last day with localization. He had some associated nausea and made himself vomit once without relied. He reports he has been a little constipated but he has not been eating much the last several days. Pain actually resolved this AM. He underwent umbilical hernia repair with mesh 09/22/21 with Dr. Fredricka Bonineonnor and had been doing well from this prior to the last week. Patient denies drug allergies and is not on any blood thinners. He vapes and has been sober from drugs and alcohol for 1.5 years.  ? ?ROS: ?Review of Systems  ?Constitutional:  Negative for chills and fever.  ?Respiratory:  Negative for shortness of breath and wheezing.   ?Cardiovascular:  Negative for chest pain and palpitations.  ?Gastrointestinal:  Positive for abdominal pain, constipation and nausea. Negative for vomiting.  ?Genitourinary:  Negative for dysuria, frequency and urgency.  ?All other systems reviewed and are negative. ? ?Family History  ?Problem Relation Age of Onset  ? Heart disease Neg Hx   ? ? ?Past Medical History:  ?Diagnosis Date  ? Incarcerated umbilical hernia   ? S/P repair by Dr. Fredricka Bonineonnor 09/22/2021  ? IV drug abuse (HCC)   ? Shingles 2022  ? ? ?Past Surgical History:  ?Procedure Laterality Date  ? UMBILICAL HERNIA REPAIR N/A 09/22/2021  ? Procedure: OPEN UMBILICAL HERNIA REPAIR WITH MESH;  Surgeon: Berna Bueonnor, Chelsea A, MD;  [facility];  Service: General;  Laterality: N/A;  ? ? ?Social History:  reports that he has quit smoking. His smoking use included cigarettes. He smoked an average of 1 pack  per day. He has never used smokeless tobacco. He reports that he does not currently use drugs after having used the following drugs: Methamphetamines. He reports that he does not drink alcohol. ? ?Allergies: No Known Allergies ? ?Medications Prior to Admission  ?Medication Sig Dispense Refill  ? ibuprofen (ADVIL) 800 MG tablet Take 1 tablet (800 mg total) by mouth every 8 (eight) hours as needed. (Patient taking differently: Take 800 mg by mouth every 8 (eight) hours as needed for mild pain, headache or fever.) 30 tablet 0  ? ? ?Blood pressure 102/80, pulse 95, temperature 97.9 ?F (36.6 ?C), temperature source Oral, resp. rate 18, height 6\' 2"  (1.88 m), weight 122.5 kg, SpO2 96 %. ?Physical Exam:  ?General: pleasant, WD, overweight male who is laying in bed in NAD ?HEENT: head is normocephalic, atraumatic.  Sclera are anicteric.  Pupils equal and round.  Ears and nose without any masses or lesions.  Mouth is pink and moist ?Heart: regular, rate, and rhythm.  Normal s1,s2. No obvious murmurs, gallops, or rubs noted.  Palpable radial and pedal pulses bilaterally ?Lungs: CTAB, no wheezes, rhonchi, or rales noted.  Respiratory effort nonlabored ?Abd: soft, NT, ND, +BS, no masses, hernias, or organomegaly ?MS: all 4 extremities are symmetrical with no cyanosis, clubbing, or edema. ?Skin: warm and dry with no masses, lesions, or rashes ?Neuro: Cranial nerves 2-12 grossly intact, sensation is normal throughout ?Psych: A&Ox3 with an appropriate affect. ? ? ?Results for orders placed or performed during the  hospital encounter of 11/14/21 (from the past 48 hour(s))  ?Urinalysis, Routine w reflex microscopic     Status: Abnormal  ? Collection Time: 11/14/21  5:30 PM  ?Result Value Ref Range  ? Color, Urine YELLOW YELLOW  ? APPearance CLOUDY (A) CLEAR  ? Specific Gravity, Urine 1.015 1.005 - 1.030  ? pH 8.5 (H) 5.0 - 8.0  ? Glucose, UA NEGATIVE NEGATIVE mg/dL  ? Hgb urine dipstick NEGATIVE NEGATIVE  ? Bilirubin Urine SMALL (A)  NEGATIVE  ? Ketones, ur NEGATIVE NEGATIVE mg/dL  ? Protein, ur 30 (A) NEGATIVE mg/dL  ? Nitrite NEGATIVE NEGATIVE  ? Leukocytes,Ua NEGATIVE NEGATIVE  ?  Comment: Performed at [facility], [address], [place], [place] [address]  ?Urinalysis, Microscopic (reflex)     Status: Abnormal  ? Collection Time: 11/14/21  5:30 PM  ?Result Value Ref Range  ? RBC / HPF NONE SEEN 0 - 5 RBC/hpf  ? WBC, UA 0-5 0 - 5 WBC/hpf  ? Bacteria, UA FEW (A) NONE SEEN  ? Squamous Epithelial / LPF 0-5 0 - 5  ?  Comment: Performed at [facility], [address], [place], [place] [address]  ?Basic metabolic panel     Status: Abnormal  ? Collection Time: 11/14/21  5:40 PM  ?Result Value Ref Range  ? Sodium 139 135 - 145 mmol/L  ? Potassium 4.2 3.5 - 5.1 mmol/L  ? Chloride 104 98 - 111 mmol/L  ? CO2 25 22 - 32 mmol/L  ? Glucose, Bld 127 (H) 70 - 99 mg/dL  ?  Comment: Glucose reference range applies only to samples taken after fasting for at least 8 hours.  ? BUN 15 6 - 20 mg/dL  ? Creatinine, Ser 0.68 0.61 - 1.24 mg/dL  ? Calcium 8.7 (L) 8.9 - 10.3 mg/dL  ? GFR, Estimated >60 >60 mL/min  ?  Comment: (NOTE) ?Calculated using the CKD-EPI Creatinine Equation (2021) ?  ? Anion gap 10 5 - 15  ?  Comment: Performed at [facility], [address], [place], [place] [address]  ?CBC     Status: Abnormal  ? Collection Time: 11/14/21  5:40 PM  ?Result Value Ref Range  ? WBC 9.8 4.0 - 10.5 K/uL  ? RBC 4.61 4.22 - 5.81 MIL/uL  ? Hemoglobin 12.9 (L) 13.0 - 17.0 g/dL  ? HCT 39.0 39.0 - 52.0 %  ? MCV 84.6 80.0 - 100.0 fL  ? MCH 28.0 26.0 - 34.0 pg  ? MCHC 33.1 30.0 - 36.0 g/dL  ? RDW 12.6 11.5 - 15.5 %  ? Platelets 196 150 - 400 K/uL  ? nRBC 0.0 0.0 - 0.2 %  ?  Comment: Performed at [facility], [address], [place], [place] [address]  ?Troponin I (High Sensitivity)     Status: None  ? Collection Time: 11/14/21  5:40 PM  ?Result Value Ref Range  ? Troponin I (High Sensitivity) 7 <18 ng/L  ?  Comment:  (NOTE) ?Elevated high sensitivity troponin I (hsTnI) values and significant  ?changes across serial measurements may suggest ACS but many other  ?chronic and acute conditions are known to elevate hsTnI results.  ?Refer to the "Links" section for chest pain algorithms and additional  ?guidance. ?Performed at [facility], [address] [place], [place] [address] ?  ?Hepatic function panel     Status: Abnormal  ? Collection Time: 11/14/21  5:40 PM  ?  Result Value Ref Range  ? Total Protein 8.0 6.5 - 8.1 g/dL  ? Albumin 4.0 3.5 - 5.0 g/dL  ? AST 236 (H) 15 - 41 U/L  ? ALT 227 (H) 0 - 44 U/L  ? Alkaline Phosphatase 332 (H) 38 - 126 U/L  ? Total Bilirubin 0.9 0.3 - 1.2 mg/dL  ? Bilirubin, Direct 0.5 (H) 0.0 - 0.2 mg/dL  ? Indirect Bilirubin 0.4 0.3 - 0.9 mg/dL  ?  Comment: Performed at [facility], [address], [place], [place] [address]  ?Lipase, blood     Status: None  ? Collection Time: 11/14/21  5:40 PM  ?Result Value Ref Range  ? Lipase 27 11 - 51 U/L  ?  Comment: Performed at [facility], [address] [place], [place] [address]  ?Rapid HIV screen (HIV 1/2 Ab+Ag)     Status: None  ? Collection Time: 11/14/21 11:20 PM  ?Result Value Ref Range  ? HIV-1 P24 Antigen - HIV24 NON REACTIVE NON REACTIVE  ?  Comment: (NOTE) ?Detection of p24 may be inhibited by biotin in the sample, causing ?false negative results in acute infection. ?  ? HIV 1/2 Antibodies NON REACTIVE NON REACTIVE  ? Interpretation (HIV Ag Ab)    ?  A non reactive test result means that HIV 1 or HIV 2 antibodies and HIV 1 p24 antigen were not detected in the specimen.  ?  Comment: Performed at [facility], [address], [place], [place] [address]  ?Brain natriuretic peptide     Status: Abnormal  ? Collection Time: 11/15/21  5:08 AM  ?Result Value Ref Range  ? B Natriuretic Peptide 112.6 (H) 0.0 - 100.0 pg/mL  ?  Comment: Performed at [facility], [address], [place],  [place] [address]  ?CK     Status: None  ? Collection Time: 11/15/21  5:08 AM  ?Result Value Ref Range  ? Total CK 118 49 - 397 U/L  ?  Comment: Performed at [facility], [address] [place]

## 2021-11-15 NOTE — Assessment & Plan Note (Addendum)
S/P lap cholecystectomy 4/25 by Dr. Gerrit Friends ?Presents with 1 week complaint of abdominal pain. ?RUQ ultrasound concerning for gallbladder sludge and gallbladder thickening. ?CT abdomen also makes comments about possible cellulitis although clinically no evidence of cellulitis or infection at the surgical site around umbilicus. ?Currently on IV antibiotics. ?General surgery and GI both consulted. ?MRCP negative for any CBD stone but shows gallbladder wall thickening. ?LFT trending up. ?Underwent laparoscopic cholecystectomy. ?We will follow-up on recommendations from surgery. ?

## 2021-11-15 NOTE — Assessment & Plan Note (Addendum)
Patient reports being abstinent of all illicit drugs for 1-1/2 years ?

## 2021-11-15 NOTE — Assessment & Plan Note (Deleted)
?   Radiology report mentions cellulitis of the periumbilical region on CT imaging with possible phlegmon ?? Clinically there is no evidence of cellulitis on exam.  This area is completely nontender nonerythematous and not indurated ?

## 2021-11-15 NOTE — H&P (Signed)
?History and Physical  ? ? ?Patient: Chris Hamilton MRN: [id number] DOA: 11/14/2021 ? ?Date of Service: the patient was seen and examined on 11/15/2021 ? ?Patient coming from: Home via [facility] ? ?Chief Complaint:  ?Chief Complaint  ?Patient presents with  ? Abdominal Pain  ? ? ?HPI:  ? ?46 year old male with past medical history of intravenous drug abuse (heroin, fentanyl, methamphetamine see [facility] admission in 2021) and recent open repair of incarcerated 2.5 cm umbilical hernia on 09/22/2021 performed by Dr. Phylliss Blakes with general surgery presenting to med [facility] emergency department with complaints of abdominal pain. ? ?Patient explains that he began to experience abdominal pain approximately 1 week ago.  Patient describes this pain is located in the right upper quadrant initially mild to moderate in intensity.  As the week progressed pain became more more severe and is now severe in intensity.  Pain is now radiating across the upper abdomen and is associated with intense nausea and vomiting on occasion.  Patient denies any associated fevers, dysuria, diarrhea, sick contacts, recent ingestion of undercooked food, recent travel. ? ?Due to patient's progressively worsening symptoms he eventually presented to med [facility] emergency department for evaluation. ? ?Upon evaluation in the emergency department patient was found to be in substantial pain and was administered Dilaudid as well as intravenous Zofran.  Right upper quadrant ultrasound was performed revealing an edematous gallbladder consistent with cholecystitis.  Initial work-up revealed an AST of 236, ALT of 227 and alkaline phosphatase of 332 with slightly elevated bilirubin of 0.5.  Patient was initiated on intravenous Rocephin.  ER provider discussed case with Dr. Carolynne Edouard with general surgery and apparently during the discussion there was concern for hepatitis and for this reason he requested that medicine admit with GI involvement with  general surgery to evaluate in consultation in the morning.  The hospitalist group was then called and patient was excepted for transfer to [facility] long hospital for continued medical care. ? ? ? ?Review of Systems: Review of Systems  ?Gastrointestinal:  Positive for abdominal pain, nausea and vomiting.  ?All other systems reviewed and are negative. ? ? ?Past Medical History:  ?Diagnosis Date  ? Incarcerated umbilical hernia   ? S/P repair by Dr. Fredricka Bonine 09/22/2021  ? IV drug abuse (HCC)   ? Shingles 2022  ? ? ?Past Surgical History:  ?Procedure Laterality Date  ? UMBILICAL HERNIA REPAIR N/A 09/22/2021  ? Procedure: OPEN UMBILICAL HERNIA REPAIR WITH MESH;  Surgeon: Berna Bue, MD;  Location: [facility];  Service: General;  Laterality: N/A;  ? ? ?Social History:  reports that he has quit smoking. His smoking use included cigarettes. He smoked an average of 1 pack per day. He has never used smokeless tobacco. He reports that he does not currently use drugs after having used the following drugs: Methamphetamines. He reports that he does not drink alcohol. ? ?No Known Allergies ? ?Family History  ?Problem Relation Age of Onset  ? Heart disease Neg Hx   ? ? ?Prior to Admission medications   ?Medication Sig Start Date End Date Taking? Authorizing Provider  ?acetaminophen (TYLENOL) 500 MG tablet Take 2 tablets (1,000 mg total) by mouth every 6 (six) hours. Take scheduled for the first 5 days after surgery, then switch to taking as needed. 09/22/21   Berna Bue, MD  ?ibuprofen (ADVIL) 800 MG tablet Take 1 tablet (800 mg total) by mouth every 8 (eight) hours as needed. 09/22/21  Berna Bueonnor, Chelsea A, MD  ?methocarbamol (ROBAXIN) 500 MG tablet Take 1 tablet (500 mg total) by mouth every 6 (six) hours as needed for muscle spasms (pain). 09/22/21   Berna Bueonnor, Chelsea A, MD  ?oxyCODONE (ROXICODONE) 5 MG immediate release tablet Take 1 tablet (5 mg total) by mouth every 8 (eight) hours as needed. Alternate tylenol  and ibuprofen for the first few days. Take narcotic pain medication only if needed for severe/ breakthrough pain. 09/22/21 09/22/22  Berna Bueonnor, Chelsea A, MD  ? ? ?Physical Exam: ? ?Vitals:  ? 11/15/21 0030 11/15/21 0100 11/15/21 0156 11/15/21 0626  ?BP: (!) 136/97 (!) 134/92 (!) 141/100 102/80  ?Pulse: 77 67 69 95  ?Resp: 18 18 16 18   ?Temp: 97.8 ?F (36.6 ?C)  98.5 ?F (36.9 ?C) 97.9 ?F (36.6 ?C)  ?TempSrc: Oral  Oral Oral  ?SpO2: 97% 96% 96% 96%  ?Weight:      ?Height:      ? ? ?Constitutional: Awake alert and oriented x3, in distress due to abdominal pain ?Skin: Well-healed surgical incision of the periumbilical region.  No associated induration or redness or warmth of this area.  No rashes, no lesions, good skin turgor noted. ?Eyes: Pupils are equally reactive to light.  No evidence of scleral icterus or conjunctival pallor.  ?ENMT: Moist mucous membranes noted.  Posterior pharynx clear of any exudate or lesions.   ?Neck: normal, supple, no masses, no thyromegaly.  No evidence of jugular venous distension.   ?Respiratory: clear to auscultation bilaterally, no wheezing, no crackles. Normal respiratory effort. No accessory muscle use.  ?Cardiovascular: Regular rate and rhythm, no murmurs / rubs / gallops. No extremity edema. 2+ pedal pulses. No carotid bruits.  ?Chest:   Nontender without crepitus or deformity.   ?Back:   Nontender without crepitus or deformity. ?Abdomen: Notable right upper quadrant and epigastric tenderness.  Abdomen soft.  No evidence of intra-abdominal masses.  Positive bowel sounds noted in all quadrants.   ?Musculoskeletal: No joint deformity upper and lower extremities. Good ROM, no contractures. Normal muscle tone.  ?Neurologic: CN 2-12 grossly intact. Sensation intact.  Patient moving all 4 extremities spontaneously.  Patient is following all commands.  Patient is responsive to verbal stimuli.   ?Psychiatric: Patient exhibits normal mood with appropriate affect.  Patient seems to possess insight  as to their current situation.   ? ?Data Reviewed: ? ?I have personally reviewed and interpreted labs, imaging. ? ?Significant findings are Glucose 127, calcium 8.7, white blood cell count 9.8, hemoglobin 12.9 troponin 7, AST 236, ALT 227, alkaline phosphatase 332, direct bilirubin 0.5, lipase 27. ? ?Right upper quadrant ultrasound revealing layering sludge in the gallbladder with abnormal thickening and hyperemia of the gallbladder wall  supportive of acute cholecystitis. ? ?CT imaging of the abdomen and pelvis with contrast revealing phlegmonous changes in the periumbilical region favoring cellulitis with subcutaneous abscess considered less likely. ? ?EKG: Personally reviewed.  Rhythm is normal sinus rhythm with heart rate of 68 bpm.  No dynamic ST segment changes appreciated. ? ? ?Assessment and Plan: ?* Acute cholecystitis ?Patient presenting with 1 week history of progressively worsening abdominal pain ?Right upper quadrant ultrasound revealing evidence of gallbladder sludge with gallbladder wall thickening concerning for acute cholecystitis ?Elevated liver enzymes as well as slight hyperbilirubinemia that gives the appearance of a cholestatic picture, hepatitis is thought to be much less likely ?Patient has been initiated on intravenous ceftriaxone by the emergency department which will be continued ?Hydrating patient with intravenous isotonic fluid ?Opiate-based  analgesics for associated pain ?ER provider discussed case with Dr. Carolynne Edouard who asked medicine to admit with GI involvement over ER provider concerns for possible hepatitis with surgery and Dr. Levora Angel with gastroenterology to consult this morning.  Surgery and GIs input is much appreciated. ? ?Abnormal LFTs ?Notable elevation of AST, ALT, alkaline phosphatase and direct bilirubin on initial hepatic function panel ?Considering the patient's presentation and ultrasound findings this gives the appearance of a cholestatic picture ?ER provider had  initial concerns for possible hepatitis and sent off a viral panel.  While this is much less likely we will go ahead and additionally obtain a creatine kinase, BNP, HIV and toxicology screen ?Patient report

## 2021-11-15 NOTE — Assessment & Plan Note (Addendum)
Notable elevation of AST, ALT, alkaline phosphatase and direct bilirubin on initial hepatic function panel ?Considering the patient's presentation and ultrasound findings this gives the appearance of a cholestatic picture ?Hepatitis panel sent.  Still pending. ?HIV negative. ?Less likely hepatitis given level of LFTs. ?

## 2021-11-15 NOTE — Hospital Course (Addendum)
46 year old male with past medical history of intravenous drug abuse (heroin, fentanyl, methamphetamine see [facility] admission in 2021) and recent open repair of incarcerated 2.5 cm umbilical hernia on 09/22/2021 performed by Dr. Phylliss Blakes with general surgery presenting to med [facility] emergency department with complaints of abdominal pain. ?Found to have elevated LFTs with concern for acute cholecystitis. ?General surgery and GI both consulted. ?S/P lap cholecystectomy on 4/25. ?

## 2021-11-15 NOTE — Consult Note (Addendum)
Referring Provider: TRH ?Primary Care Physician:  Patient, No Pcp Per (Inactive) ?Primary Gastroenterologist:  Gentry FitzUnassigned ? ?Reason for Consultation:  RUQ pain, elevated LFTs ? ?HPI: Chris PolioChristopher M Hamilton is a 46 y.o. male past medical history of intravenous drug abuse (heroin, fentanyl, [facility]) and recent open repair of incarcerated 2.5 cm umbilical hernia on 09/22/2021 performed by Dr. Phylliss Blakeshelsea Connor with general surgery presenting [facility] department with complaints of abdominal pain. ? ?He notes RUQ pain for 1 week.  He notes initially the pain was bothersome but it worsened in intensity throughout the week.  Yesterday he had generalized abdominal pain was more severe.  He had associated nausea yesterday.  Notes the pain is worse with movement, coughing, hiccuping.  Pain is not associated with bowel movements or worsened with eating.  Did not find anything to relieve the pain at home.    ? ?In the ED right upper quadrant ultrasound revealed an edematous gallbladder consistent with cholecystitis.  He had notable elevated LFTs and slightly elevated bilirubin.  He was started on IV Rocephin.  He was also given Dilaudid and IV Zofran for pain and nausea control. ? ?On examination today patient states he is feeling much better, he has no abdominal pain today. ? ?He denies fevers, chills, constipation, diarrhea, vomiting, melena.  He notes a streak of blood on his stool paper several days ago.  Denies any blood in his most recent stools.  Denies sick contacts.  Denies family history of liver or gallbladder issues.  Denies family history of colon cancer.  Denies previous colonoscopy or EGD. ? ?Patient denies any IV drug use or alcohol in the last 1-1/2 years.  Patient does use e-cigarettes daily.  ? ?He takes Advil twice weekly.  ? ?Past Medical History:  ?Diagnosis Date  ? Incarcerated umbilical hernia   ? S/P repair by Dr. Fredricka Bonineonnor 09/22/2021  ? IV drug abuse  (HCC)   ? Shingles 2022  ? ? ?Past Surgical History:  ?Procedure Laterality Date  ? UMBILICAL HERNIA REPAIR N/A 09/22/2021  ? Procedure: OPEN UMBILICAL HERNIA REPAIR WITH MESH;  Surgeon: Berna Bueonnor, Chelsea A, MD;  [facility];  Service: General;  Laterality: N/A;  ? ? ?Prior to Admission medications   ?Medication Sig Start Date End Date Taking? Authorizing Provider  ?acetaminophen (TYLENOL) 500 MG tablet Take 2 tablets (1,000 mg total) by mouth every 6 (six) hours. Take scheduled for the first 5 days after surgery, then switch to taking as needed. 09/22/21   Berna Bueonnor, Chelsea A, MD  ?ibuprofen (ADVIL) 800 MG tablet Take 1 tablet (800 mg total) by mouth every 8 (eight) hours as needed. 09/22/21   Berna Bueonnor, Chelsea A, MD  ?methocarbamol (ROBAXIN) 500 MG tablet Take 1 tablet (500 mg total) by mouth every 6 (six) hours as needed for muscle spasms (pain). 09/22/21   Berna Bueonnor, Chelsea A, MD  ?oxyCODONE (ROXICODONE) 5 MG immediate release tablet Take 1 tablet (5 mg total) by mouth every 8 (eight) hours as needed. Alternate tylenol and ibuprofen for the first few days. Take narcotic pain medication only if needed for severe/ breakthrough pain. 09/22/21 09/22/22  Berna Bueonnor, Chelsea A, MD  ? ? ?Scheduled Meds: ?Continuous Infusions: ? cefTRIAXone (ROCEPHIN)  IV    ? lactated ringers 125 mL/hr at 11/15/21 0527  ? ?PRN Meds:.acetaminophen **OR** acetaminophen, HYDROmorphone (DILAUDID) injection **OR** HYDROmorphone (DILAUDID) injection, ondansetron **OR** ondansetron (ZOFRAN) IV, polyethylene glycol ? ?Allergies as of 11/14/2021  ? (  No Known Allergies)  ? ? ?Family History  ?Problem Relation Age of Onset  ? Heart disease Neg Hx   ? ? ?Social History  ? ?Socioeconomic History  ? Marital status: Single  ?  Spouse name: Not on file  ? Number of children: Not on file  ? Years of education: Not on file  ? Highest education level: Not on file  ?Occupational History  ? Not on file  ?Tobacco Use  ? Smoking status: Former  ?   Packs/day: 1.00  ?  Types: Cigarettes  ? Smokeless tobacco: Never  ?Vaping Use  ? Vaping Use: Every day  ?Substance and Sexual Activity  ? Alcohol use: No  ? Drug use: Not Currently  ?  Types: Methamphetamines  ?  Comment: past meth and heroin use  ? Sexual activity: Yes  ?Other Topics Concern  ? Not on file  ?Social History Narrative  ? Not on file  ? ?Social Determinants of Health  ? ?Financial Resource Strain: Not on file  ?Food Insecurity: Not on file  ?Transportation Needs: Not on file  ?Physical Activity: Not on file  ?Stress: Not on file  ?Social Connections: Not on file  ?Intimate Partner Violence: Not on file  ? ? ?Review of Systems: Review of Systems  ?Constitutional:  Negative for chills and fever.  ?HENT:  Negative for hearing loss and tinnitus.   ?Eyes:  Negative for blurred vision and double vision.  ?Respiratory:  Negative for cough and sputum production.   ?Cardiovascular:  Negative for chest pain and palpitations.  ?Gastrointestinal:  Positive for abdominal pain and nausea. Negative for blood in stool, constipation, diarrhea, heartburn, melena and vomiting.  ?Genitourinary:  Negative for dysuria and urgency.  ?Musculoskeletal:  Negative for myalgias and neck pain.  ?Skin:  Negative for itching and rash.  ?Neurological:  Negative for dizziness and headaches.  ?Endo/Heme/Allergies:  Negative for environmental allergies. Does not bruise/bleed easily.  ?Psychiatric/Behavioral:  Negative for depression and hallucinations.   ?. ? ?Physical Exam:Physical Exam ?Constitutional:   ?   General: He is not in acute distress. ?   Appearance: Normal appearance. He is not ill-appearing.  ?HENT:  ?   Head: Normocephalic and atraumatic.  ?   Right Ear: External ear normal.  ?   Left Ear: External ear normal.  ?   Nose: Nose normal.  ?   Mouth/Throat:  ?   Mouth: Mucous membranes are moist.  ?Eyes:  ?   Conjunctiva/sclera: Conjunctivae normal.  ?   Pupils: Pupils are equal, round, and reactive to light.   ?Cardiovascular:  ?   Rate and Rhythm: Normal rate and regular rhythm.  ?   Pulses: Normal pulses.  ?   Heart sounds: Normal heart sounds.  ?Pulmonary:  ?   Effort: Pulmonary effort is normal.  ?   Breath sounds: Normal breath sounds.  ?Abdominal:  ?   General: Abdomen is flat. Bowel sounds are normal. There is no distension.  ?   Palpations: Abdomen is soft. There is no mass.  ?   Tenderness: There is no abdominal tenderness. There is no guarding or rebound.  ?   Hernia: No hernia is present.  ?Musculoskeletal:     ?   General: Normal range of motion.  ?   Cervical back: Normal range of motion.  ?Skin: ?   General: Skin is warm and dry.  ?   Coloration: Skin is not jaundiced or pale.  ?Neurological:  ?   General: No  focal deficit present.  ?   Mental Status: He is alert and oriented to person, place, and time.  ?Psychiatric:     ?   Mood and Affect: Mood normal.     ?   Behavior: Behavior normal.  ?  ?Vital signs: ?Vitals:  ? 11/15/21 0156 11/15/21 0626  ?BP: (!) 141/100 102/80  ?Pulse: 69 95  ?Resp: 16 18  ?Temp: 98.5 ?F (36.9 ?C) 97.9 ?F (36.6 ?C)  ?SpO2: 96% 96%  ? ?Last BM Date : 11/14/21 ? ? ? ?GI:  ?Lab Results: ?Recent Labs  ?  11/14/21 ?1740 11/15/21 ?5638  ?WBC 9.8 9.8  ?HGB 12.9* 12.2*  ?HCT 39.0 36.8*  ?PLT 196 176  ? ?BMET ?Recent Labs  ?  11/14/21 ?1740 11/15/21 ?9373  ?NA 139 138  ?K 4.2 4.0  ?CL 104 109  ?CO2 25 23  ?GLUCOSE 127* 125*  ?BUN 15 11  ?CREATININE 0.68 0.52*  ?CALCIUM 8.7* 8.3*  ? ?LFT ?Recent Labs  ?  11/14/21 ?1740 11/15/21 ?4287  ?PROT 8.0 7.0  ?ALBUMIN 4.0 3.6  ?AST 236* 329*  ?ALT 227* 482*  ?ALKPHOS 332* 353*  ?BILITOT 0.9 2.3*  ?BILIDIR 0.5*  --   ?IBILI 0.4  --   ? ?PT/INR ?Recent Labs  ?  11/15/21 ?6811  ?LABPROT 13.3  ?INR 1.0  ? ? ? ?Studies/Results: ?CT Abdomen Pelvis W Contrast ? ?Result Date: 11/14/2021 ?CLINICAL DATA:  Abdominal pain, nausea, recent umbilical hernia surgery EXAM: CT ABDOMEN AND PELVIS WITH CONTRAST TECHNIQUE: Multidetector CT imaging of the abdomen and  pelvis was performed using the standard protocol following bolus administration of intravenous contrast. RADIATION DOSE REDUCTION: This exam was performed according to the departmental dose-optimization program which includes au

## 2021-11-16 ENCOUNTER — Encounter (HOSPITAL_COMMUNITY): Payer: Self-pay | Admitting: Internal Medicine

## 2021-11-16 ENCOUNTER — Other Ambulatory Visit: Payer: Self-pay

## 2021-11-16 ENCOUNTER — Inpatient Hospital Stay (HOSPITAL_COMMUNITY): Payer: BC Managed Care – PPO | Admitting: Certified Registered"

## 2021-11-16 ENCOUNTER — Encounter (HOSPITAL_COMMUNITY)
Admission: EM | Disposition: A | Discharge status: Home / Self Care | Payer: Self-pay | Source: Home / Self Care | Attending: Internal Medicine

## 2021-11-16 DIAGNOSIS — K810 Acute cholecystitis: Secondary | ICD-10-CM | POA: Diagnosis not present

## 2021-11-16 HISTORY — PX: CHOLECYSTECTOMY: SHX55

## 2021-11-16 LAB — COMPREHENSIVE METABOLIC PANEL
ALT: 451 U/L — ABNORMAL HIGH (ref 0–44)
AST: 202 U/L — ABNORMAL HIGH (ref 15–41)
Albumin: 3.4 g/dL — ABNORMAL LOW (ref 3.5–5.0)
Alkaline Phosphatase: 305 U/L — ABNORMAL HIGH (ref 38–126)
Anion gap: 6 (ref 5–15)
BUN: 8 mg/dL (ref 6–20)
CO2: 25 mmol/L (ref 22–32)
Calcium: 8.5 mg/dL — ABNORMAL LOW (ref 8.9–10.3)
Chloride: 109 mmol/L (ref 98–111)
Creatinine, Ser: 0.63 mg/dL (ref 0.61–1.24)
GFR, Estimated: >60 mL/min (ref >60)
Glucose, Bld: 98 mg/dL (ref 70–99)
Potassium: 3.6 mmol/L (ref 3.5–5.1)
Sodium: 140 mmol/L (ref 135–145)
Total Bilirubin: 1 mg/dL (ref 0.3–1.2)
Total Protein: 6.5 g/dL (ref 6.5–8.1)

## 2021-11-16 LAB — CBC
HCT: 35.5 % — ABNORMAL LOW (ref 39.0–52.0)
Hemoglobin: 11.7 g/dL — ABNORMAL LOW (ref 13.0–17.0)
MCH: 28.3 pg (ref 26.0–34.0)
MCHC: 33 g/dL (ref 30.0–36.0)
MCV: 86 fL (ref 80.0–100.0)
Platelets: 151 10*3/uL (ref 150–400)
RBC: 4.13 MIL/uL — ABNORMAL LOW (ref 4.22–5.81)
RDW: 12.7 % (ref 11.5–15.5)
WBC: 6.8 10*3/uL (ref 4.0–10.5)
nRBC: 0 % (ref 0.0–0.2)

## 2021-11-16 LAB — HCV RT-PCR, QUANT (NON-GRAPH)
HCV log10: 4.021 log10 IU/mL
Hepatitis C Quantitation: 10500 IU/mL

## 2021-11-16 SURGERY — LAPAROSCOPIC CHOLECYSTECTOMY WITH INTRAOPERATIVE CHOLANGIOGRAM
Anesthesia: General

## 2021-11-16 MED ORDER — DOCUSATE SODIUM 100 MG PO CAPS: 100.0000 mg | ORAL_CAPSULE | Freq: Two times a day (BID) | ORAL | Status: DC | PRN

## 2021-11-16 MED ORDER — LIDOCAINE 2% (20 MG/ML) 5 ML SYRINGE
INTRAMUSCULAR | Status: DC | PRN
  Administered 2021-11-16: 100 mg via INTRAVENOUS

## 2021-11-16 MED ORDER — BUPIVACAINE-EPINEPHRINE (PF) 0.25% -1:200000 IJ SOLN
INTRAMUSCULAR | Status: DC | PRN
  Administered 2021-11-16: 30 mL

## 2021-11-16 MED ORDER — PROPOFOL 10 MG/ML IV BOLUS
INTRAVENOUS | Status: AC
  Filled 2021-11-16: qty 20

## 2021-11-16 MED ORDER — CHLORHEXIDINE GLUCONATE CLOTH 2 % EX PADS: 6.0000 | MEDICATED_PAD | Freq: Once | CUTANEOUS | Status: DC

## 2021-11-16 MED ORDER — DEXAMETHASONE SODIUM PHOSPHATE 10 MG/ML IJ SOLN
INTRAMUSCULAR | Status: AC
  Filled 2021-11-16: qty 1

## 2021-11-16 MED ORDER — ONDANSETRON HCL 4 MG/2ML IJ SOLN
INTRAMUSCULAR | Status: DC | PRN
  Administered 2021-11-16: 4 mg via INTRAVENOUS

## 2021-11-16 MED ORDER — MIDAZOLAM HCL 5 MG/5ML IJ SOLN
INTRAMUSCULAR | Status: DC | PRN
  Administered 2021-11-16: 2 mg via INTRAVENOUS

## 2021-11-16 MED ORDER — ONDANSETRON HCL 4 MG/2ML IJ SOLN: 4.0000 mg | Freq: Once | INTRAMUSCULAR | Status: DC | PRN

## 2021-11-16 MED ORDER — HYDROMORPHONE HCL 1 MG/ML IJ SOLN
INTRAMUSCULAR | Status: DC | PRN
  Administered 2021-11-16 (×2): 1 mg via INTRAVENOUS

## 2021-11-16 MED ORDER — LIDOCAINE HCL (PF) 2 % IJ SOLN
INTRAMUSCULAR | Status: AC
  Filled 2021-11-16: qty 5

## 2021-11-16 MED ORDER — HEMOSTATIC AGENTS (NO CHARGE) OPTIME
TOPICAL | Status: DC | PRN
  Administered 2021-11-16: 1

## 2021-11-16 MED ORDER — SUGAMMADEX SODIUM 200 MG/2ML IV SOLN
INTRAVENOUS | Status: DC | PRN
  Administered 2021-11-16: 250 mg via INTRAVENOUS

## 2021-11-16 MED ORDER — POLYETHYLENE GLYCOL 3350 17 G PO PACK: 17.0000 g | PACK | Freq: Every day | ORAL | Status: DC | PRN

## 2021-11-16 MED ORDER — PROPOFOL 10 MG/ML IV BOLUS
INTRAVENOUS | Status: DC | PRN
  Administered 2021-11-16: 30 mg via INTRAVENOUS
  Administered 2021-11-16: 180 mg via INTRAVENOUS

## 2021-11-16 MED ORDER — MORPHINE SULFATE (PF) 2 MG/ML IV SOLN: 2.0000 mg | INTRAVENOUS | Status: DC | PRN

## 2021-11-16 MED ORDER — DEXMEDETOMIDINE (PRECEDEX) IN NS 20 MCG/5ML (4 MCG/ML) IV SYRINGE
PREFILLED_SYRINGE | INTRAVENOUS | Status: AC
  Filled 2021-11-16: qty 10

## 2021-11-16 MED ORDER — FENTANYL CITRATE PF 50 MCG/ML IJ SOSY: 25.0000 ug | PREFILLED_SYRINGE | INTRAMUSCULAR | Status: DC | PRN

## 2021-11-16 MED ORDER — 0.9 % SODIUM CHLORIDE (POUR BTL) OPTIME
TOPICAL | Status: DC | PRN
  Administered 2021-11-16: 1000 mL

## 2021-11-16 MED ORDER — DEXAMETHASONE SODIUM PHOSPHATE 10 MG/ML IJ SOLN
INTRAMUSCULAR | Status: DC | PRN
Start: 2021-11-16 — End: 2021-11-16
  Administered 2021-11-16: 8 mg via INTRAVENOUS

## 2021-11-16 MED ORDER — KETOROLAC TROMETHAMINE 30 MG/ML IJ SOLN
INTRAMUSCULAR | Status: AC
  Filled 2021-11-16: qty 1

## 2021-11-16 MED ORDER — BUPIVACAINE-EPINEPHRINE (PF) 0.25% -1:200000 IJ SOLN
INTRAMUSCULAR | Status: AC
  Filled 2021-11-16: qty 30

## 2021-11-16 MED ORDER — METHOCARBAMOL 500 MG PO TABS
500.0000 mg | ORAL_TABLET | Freq: Three times a day (TID) | ORAL | Status: DC | PRN
  Administered 2021-11-16: 500 mg via ORAL
  Filled 2021-11-16: qty 1

## 2021-11-16 MED ORDER — KETOROLAC TROMETHAMINE 30 MG/ML IJ SOLN
INTRAMUSCULAR | Status: DC | PRN
  Administered 2021-11-16: 30 mg via INTRAVENOUS

## 2021-11-16 MED ORDER — MIDAZOLAM HCL 2 MG/2ML IJ SOLN
INTRAMUSCULAR | Status: AC
  Filled 2021-11-16: qty 2

## 2021-11-16 MED ORDER — OXYCODONE HCL 5 MG PO TABS
5.0000 mg | ORAL_TABLET | ORAL | Status: DC | PRN
  Administered 2021-11-16 – 2021-11-17 (×3): 5 mg via ORAL
  Filled 2021-11-16 (×3): qty 1

## 2021-11-16 MED ORDER — LACTATED RINGERS IV SOLN: INTRAVENOUS | Status: DC

## 2021-11-16 MED ORDER — ACETAMINOPHEN 500 MG PO TABS: 1000.0000 mg | ORAL_TABLET | Freq: Four times a day (QID) | ORAL | Status: DC | PRN

## 2021-11-16 MED ORDER — FENTANYL CITRATE (PF) 250 MCG/5ML IJ SOLN
INTRAMUSCULAR | Status: AC
  Filled 2021-11-16: qty 5

## 2021-11-16 MED ORDER — HYDROMORPHONE HCL 2 MG/ML IJ SOLN
INTRAMUSCULAR | Status: AC
  Filled 2021-11-16: qty 1

## 2021-11-16 MED ORDER — DEXMEDETOMIDINE (PRECEDEX) IN NS 20 MCG/5ML (4 MCG/ML) IV SYRINGE
PREFILLED_SYRINGE | INTRAVENOUS | Status: DC | PRN
  Administered 2021-11-16: 8 ug via INTRAVENOUS
  Administered 2021-11-16: 12 ug via INTRAVENOUS

## 2021-11-16 MED ORDER — LACTATED RINGERS IR SOLN
Status: DC | PRN
  Administered 2021-11-16: 1000 mL

## 2021-11-16 MED ORDER — FENTANYL CITRATE (PF) 100 MCG/2ML IJ SOLN
INTRAMUSCULAR | Status: DC | PRN
  Administered 2021-11-16 (×3): 50 ug via INTRAVENOUS
  Administered 2021-11-16: 100 ug via INTRAVENOUS

## 2021-11-16 MED ORDER — ROCURONIUM BROMIDE 10 MG/ML (PF) SYRINGE
PREFILLED_SYRINGE | INTRAVENOUS | Status: AC
  Filled 2021-11-16: qty 10

## 2021-11-16 MED ORDER — ONDANSETRON HCL 4 MG/2ML IJ SOLN
INTRAMUSCULAR | Status: AC
  Filled 2021-11-16: qty 2

## 2021-11-16 MED ORDER — SODIUM CHLORIDE 0.9 % IV SOLN: INTRAVENOUS | Status: DC

## 2021-11-16 MED ORDER — ROCURONIUM BROMIDE 100 MG/10ML IV SOLN
INTRAVENOUS | Status: DC | PRN
  Administered 2021-11-16: 20 mg via INTRAVENOUS
  Administered 2021-11-16: 60 mg via INTRAVENOUS

## 2021-11-16 SURGICAL SUPPLY — 49 items
ADH SKN CLS APL DERMABOND .7 (GAUZE/BANDAGES/DRESSINGS) ×1
APL PRP STRL LF DISP 70% ISPRP (MISCELLANEOUS) ×2
APPLIER CLIP 5 13 M/L LIGAMAX5 (MISCELLANEOUS) ×2
APPLIER CLIP ROT 10 11.4 M/L (STAPLE) ×2
APR CLP MED LRG 11.4X10 (STAPLE) ×1
APR CLP MED LRG 5 ANG JAW (MISCELLANEOUS) ×1
BAG COUNTER SPONGE SURGICOUNT (BAG) IMPLANT
BAG RETRIEVAL 10 (BASKET) ×1
BAG SPNG CNTER NS LX DISP (BAG)
CABLE HIGH FREQUENCY MONO STRZ (ELECTRODE) ×2 IMPLANT
CHLORAPREP W/TINT 26 (MISCELLANEOUS) ×4 IMPLANT
CLIP APPLIE 5 13 M/L LIGAMAX5 (MISCELLANEOUS) IMPLANT
CLIP APPLIE ROT 10 11.4 M/L (STAPLE) ×1 IMPLANT
COVER MAYO STAND XLG (MISCELLANEOUS) ×3 IMPLANT
COVER SURGICAL LIGHT HANDLE (MISCELLANEOUS) ×3 IMPLANT
DERMABOND ADVANCED (GAUZE/BANDAGES/DRESSINGS) ×1
DERMABOND ADVANCED .7 DNX12 (GAUZE/BANDAGES/DRESSINGS) IMPLANT
DRAPE C-ARM 42X120 X-RAY (DRAPES) ×3 IMPLANT
ELECT REM PT RETURN 15FT ADLT (MISCELLANEOUS) ×3 IMPLANT
GAUZE SPONGE 2X2 8PLY STRL LF (GAUZE/BANDAGES/DRESSINGS) ×1 IMPLANT
GLOVE SURG ORTHO 8.0 STRL STRW (GLOVE) ×2 IMPLANT
GLOVE SURG SYN 7.5  E (GLOVE) ×4
GLOVE SURG SYN 7.5 E (GLOVE) ×2 IMPLANT
GLOVE SURG SYN 7.5 PF PI (GLOVE) ×2 IMPLANT
GOWN STRL REUS W/ TWL XL LVL3 (GOWN DISPOSABLE) ×2 IMPLANT
GOWN STRL REUS W/TWL XL LVL3 (GOWN DISPOSABLE) ×4
HEMOSTAT SURGICEL 4X8 (HEMOSTASIS) ×1 IMPLANT
IRRIG SUCT STRYKERFLOW 2 WTIP (MISCELLANEOUS) ×2
IRRIGATION SUCT STRKRFLW 2 WTP (MISCELLANEOUS) ×2 IMPLANT
KIT BASIN OR (CUSTOM PROCEDURE TRAY) ×2 IMPLANT
KIT TURNOVER KIT A (KITS) IMPLANT
PENCIL SMOKE EVACUATOR (MISCELLANEOUS) IMPLANT
SCISSORS LAP 5X35 DISP (ENDOMECHANICALS) ×3 IMPLANT
SET CHOLANGIOGRAPH MIX (MISCELLANEOUS) ×2 IMPLANT
SET TUBE SMOKE EVAC HIGH FLOW (TUBING) IMPLANT
SLEEVE XCEL OPT CAN 5 100 (ENDOMECHANICALS) ×4 IMPLANT
SPIKE FLUID TRANSFER (MISCELLANEOUS) ×3 IMPLANT
SPONGE GAUZE 2X2 STER 10/PKG (GAUZE/BANDAGES/DRESSINGS) ×1
STRIP CLOSURE SKIN 1/2X4 (GAUZE/BANDAGES/DRESSINGS) IMPLANT
SUT MNCRL AB 4-0 PS2 18 (SUTURE) ×3 IMPLANT
SYS BAG RETRIEVAL 10MM (BASKET) ×1
SYSTEM BAG RETRIEVAL 10MM (BASKET) ×2 IMPLANT
TOWEL OR 17X26 10 PK STRL BLUE (TOWEL DISPOSABLE) ×2 IMPLANT
TOWEL OR NON WOVEN STRL DISP B (DISPOSABLE) ×2 IMPLANT
TRAY LAPAROSCOPIC (CUSTOM PROCEDURE TRAY) ×2 IMPLANT
TROCAR BLADELESS OPT 5 100 (ENDOMECHANICALS) ×2 IMPLANT
TROCAR XCEL BLUNT TIP 100MML (ENDOMECHANICALS) ×2 IMPLANT
TROCAR XCEL NON-BLD 11X100MML (ENDOMECHANICALS) ×2 IMPLANT
TROCAR XCEL NON-BLD 5MMX100MML (ENDOMECHANICALS) ×1 IMPLANT

## 2021-11-16 NOTE — Transfer of Care (Signed)
Immediate Anesthesia Transfer of Care Note ? ?Patient: Chris Hamilton ? ?Procedure(s) Performed: LAPAROSCOPIC CHOLECYSTECTOMY ? ?Patient Location: PACU ? ?Anesthesia Type:General ? ?Level of Consciousness: awake, alert  and oriented ? ?Airway & Oxygen Therapy: Patient Spontanous Breathing ? ?Post-op Assessment: Report given to RN and Post -op Vital signs reviewed and stable ? ?Post vital signs: Reviewed and stable ? ?Last Vitals:  ?Vitals Value Taken Time  ?BP 122/83 11/16/21 1153  ?Temp    ?Pulse 81 11/16/21 1154  ?Resp    ?SpO2 90 % 11/16/21 1154  ?Vitals shown include unvalidated device data. ? ?Last Pain:  ?Vitals:  ? 11/16/21 0826  ?TempSrc:   ?PainSc: 0-No pain  ?   ? ?  ? ?Complications: No notable events documented. ?

## 2021-11-16 NOTE — Op Note (Signed)
Procedure Note ? ?Pre-operative Diagnosis:  cholecystitis with sludge, elevated liver enzymes ? ?Post-operative Diagnosis:  same ? ?Surgeon:  Darnell Level, MD ? ?Assistant:  Leary Roca, PA-C  ? ?Procedure:  Laparoscopic cholecystectomy ? ?Anesthesia:  General ? ?Estimated Blood Loss:  minimal ? ?Drains: none ?        ?Specimen: gallbladder to pathology ? ?Indications: Patient is a 46 year old male admitted to the medical service with abdominal pain and elevated liver enzymes.  MRCP was negative for common bile duct stones.  Ultrasound and CT scan demonstrated a thick-walled gallbladder containing sludge.  Patient now comes to surgery for cholecystectomy for cholecystitis with elevated liver enzymes. ? ?Procedure description: The patient was seen in the pre-op holding area. The risks, benefits, complications, treatment options, and expected outcomes were previously discussed with the patient. The patient agreed with the proposed plan and has signed the informed consent form. ? ?The patient was transported to operating room #4 at the [facility]. The patient was placed in the supine position on the operating room table. Following induction of general anesthesia, the abdomen was prepped and draped in the usual aseptic fashion. ? ?An incision was made in the skin near the left costal margin.  A 5 mm Optiview trocar was then advanced under direct vision into the peritoneal cavity.  Pneumoperitoneum was established.  The abdomen was explored with a 5 mm scope.  There were significant omental adhesions to the recent umbilical hernia repair.  An incision was made in the midline just above the umbilicus and an 11 mm trocar was advanced under direct vision into the peritoneal cavity above the site of the hernia repair.  Additional cannulae were introduced under direct vision along the right costal margin in the midline, mid-clavicular line, and anterior axillary line. ?  ?The gallbladder was identified and the  fundus grasped and retracted cephalad. Adhesions were taken down bluntly and the electrocautery was utilized as needed, taking care not to involve any adjacent structures. The infundibulum was grasped and retracted laterally, exposing the peritoneum overlying the triangle of Calot. The peritoneum was incised and structures exposed with blunt dissection. The cystic duct was clearly identified, bluntly dissected circumferentially, and clipped at the neck of the gallbladder. The cystic duct was then ligated with ligaclips and divided. The cystic artery was identified, dissected circumferentially, ligated with ligaclips, and divided. ? ?The gallbladder was dissected away from the gallbladder bed using the electrocautery for hemostasis. The gallbladder was completely removed from the liver and placed into an endocatch bag. The gallbladder was removed in the endocatch bag through the umbilical port site and submitted to pathology for review. ? ?The right upper quadrant was irrigated and the gallbladder bed was inspected. Hemostasis was achieved with the electrocautery. ? ?Cannulae were removed under direct vision and good hemostasis was noted. Pneumoperitoneum was released and the majority of the carbon dioxide evacuated. The umbilical wound was irrigated and the 11 mm trocar site was closed with a 0 Vicryl figure-of-eight suture.  Local anesthetic was infiltrated at all port sites. Skin incisions were closed with 4-0 Monocril subcuticular sutures and Dermabond was applied. ? ?Instrument, sponge, and needle counts were correct at the conclusion of the case.  The patient was awakened from anesthesia and brought to the recovery room in stable condition.  The patient tolerated the procedure well. ? ? ?Darnell Level, MD ?[facility] ?Office: [phone] ?  ?

## 2021-11-16 NOTE — Anesthesia Preprocedure Evaluation (Addendum)
Anesthesia Evaluation  ?Patient identified by MRN, date of birth, ID band ?Patient awake ? ? ? ?Reviewed: ?Allergy & Precautions, NPO status , Patient's Chart, lab work & pertinent test results ? ?Airway ?Mallampati: II ? ?TM Distance: >3 FB ?Neck ROM: Full ? ? ? Dental ? ?(+) Teeth Intact, Dental Advisory Given, Loose,  ?  ?Pulmonary ?Current SmokerPatient did not abstain from smoking.,  ?  ?Pulmonary exam normal ?breath sounds clear to auscultation ? ? ? ? ? ? Cardiovascular ?negative cardio ROS ?Normal cardiovascular exam ?Rhythm:Regular Rate:Normal ? ? ?  ?Neuro/Psych ?negative neurological ROS ? negative psych ROS  ? GI/Hepatic ?(+)  ?  ? substance abuse (heroin, fentanyl, methamphetamine; Patient reports being abstinent of all illicit drugs for 1-1/2 years) ? IV drug use, ACUTE CHOLECYSTITIS ?recent open repair of incarcerated 2.5 cm umbilical hernia on 09/22/2021  ?  ?Endo/Other  ?Obesity ? ? Renal/GU ?negative Renal ROS  ? ?  ?Musculoskeletal ?negative musculoskeletal ROS ?(+)  ? Abdominal ?  ?Peds ? Hematology ? ?(+) Blood dyscrasia, anemia ,   ?Anesthesia Other Findings ? ? Reproductive/Obstetrics ? ?  ? ? ? ? ? ? ? ? ? ? ? ? ? ?  ?  ? ? ? ? ? ? ? ?Anesthesia Physical ?Anesthesia Plan ? ?ASA: 2 ? ?Anesthesia Plan: General  ? ?Post-op Pain Management:   ? ?Induction: Intravenous ? ?PONV Risk Score and Plan: 3 and Midazolam, Dexamethasone and Ondansetron ? ?Airway Management Planned: Oral ETT ? ?Additional Equipment:  ? ?Intra-op Plan:  ? ?Post-operative Plan: Extubation in OR ? ?Informed Consent: I have reviewed the patients History and Physical, chart, labs and discussed the procedure including the risks, benefits and alternatives for the proposed anesthesia with the patient or authorized representative who has indicated his/her understanding and acceptance.  ? ? ? ?Dental advisory given ? ?Plan Discussed with: CRNA ? ?Anesthesia Plan Comments:   ? ? ? ? ? ?Anesthesia Quick  Evaluation ? ?

## 2021-11-16 NOTE — Anesthesia Procedure Notes (Signed)
Procedure Name: Intubation ?Date/Time: 11/16/2021 10:21 AM ?Performed by: Gwyndolyn Saxon, CRNA ?Pre-anesthesia Checklist: Patient identified, Emergency Drugs available, Suction available and Patient being monitored ?Patient Re-evaluated:Patient Re-evaluated prior to induction ?Oxygen Delivery Method: Circle system utilized ?Preoxygenation: Pre-oxygenation with 100% oxygen ?Induction Type: IV induction ?Ventilation: Mask ventilation without difficulty ?Laryngoscope Size: Mac and 4 ?Grade View: Grade III ?Tube type: Oral ?Tube size: 8.0 mm ?Number of attempts: 2 ?Airway Equipment and Method: Patient positioned with wedge pillow and Stylet ?Placement Confirmation: positive ETCO2 and breath sounds checked- equal and bilateral ?Secured at: 24 cm ?Tube secured with: Tape ?Dental Injury: Teeth and Oropharynx as per pre-operative assessment  ?Comments: Easy mask; DL #1 with Miller 2; no view. DL #2 with MAC 3 and grade 3 view ? ? ? ? ?

## 2021-11-16 NOTE — Anesthesia Postprocedure Evaluation (Signed)
Anesthesia Post Note ? ?Patient: Rosalie Akey Desjardin ? ?Procedure(s) Performed: LAPAROSCOPIC CHOLECYSTECTOMY ? ?  ? ?Patient location during evaluation: PACU ?Anesthesia Type: General ?Level of consciousness: awake and alert ?Pain management: pain level controlled ?Vital Signs Assessment: post-procedure vital signs reviewed and stable ?Respiratory status: spontaneous breathing, nonlabored ventilation, respiratory function stable and patient connected to nasal cannula oxygen ?Cardiovascular status: blood pressure returned to baseline and stable ?Postop Assessment: no apparent nausea or vomiting ?Anesthetic complications: no ? ? ?No notable events documented. ? ?Last Vitals:  ?Vitals:  ? 11/16/21 1226 11/16/21 1236  ?BP:  (!) 138/92  ?Pulse: 72 73  ?Resp: 16 16  ?Temp:  36.7 ?C  ?SpO2: 96% 96%  ?  ?Last Pain:  ?Vitals:  ? 11/16/21 1236  ?TempSrc: Oral  ?PainSc:   ? ? ?  ?  ?  ?  ?  ?  ? ?Santa Lighter ? ? ? ? ?

## 2021-11-16 NOTE — Discharge Instructions (Signed)
[facility]  Please arrive at least 30 min before your appointment to complete your check in paperwork.  If you are unable to arrive 30 min prior to your appointment time we may have to cancel or reschedule you. LAPAROSCOPIC SURGERY: POST OP INSTRUCTIONS Always review your discharge instruction sheet given to you by the facility where your surgery was performed. IF YOU HAVE DISABILITY OR FAMILY LEAVE FORMS, YOU MUST BRING THEM TO THE OFFICE FOR PROCESSING.   DO NOT GIVE THEM TO YOUR DOCTOR.  PAIN CONTROL  First take acetaminophen (Tylenol) AND/or ibuprofen (Advil) to control your pain after surgery.  Follow directions on package.  Taking acetaminophen (Tylenol) and/or ibuprofen (Advil) regularly after surgery will help to control your pain and lower the amount of prescription pain medication you may need.  You should not take more than 4,000 mg (4 grams) of acetaminophen (Tylenol) in 24 hours.  You should not take ibuprofen (Advil), aleve, motrin, naprosyn or other NSAIDS if you have a history of stomach ulcers or chronic kidney disease.  A prescription for pain medication may be given to you upon discharge.  Take your pain medication as prescribed, if you still have uncontrolled pain after taking acetaminophen (Tylenol) or ibuprofen (Advil). Use ice packs to help control pain. If you need a refill on your pain medication, please contact your pharmacy.  They will contact our office to request authorization. Prescriptions will not be filled after 5pm or on week-ends.  HOME MEDICATIONS Take your usually prescribed medications unless otherwise directed.  DIET You should follow a light diet the first few days after arrival home.  Be sure to include lots of fluids daily. Avoid fatty, fried foods.   CONSTIPATION It is common to experience some constipation after surgery and if you are taking pain medication.  Increasing fluid intake and taking a stool softener (such as Colace)  will usually help or prevent this problem from occurring.  A mild laxative (Milk of Magnesia or Miralax) should be taken according to package instructions if there are no bowel movements after 48 hours.  WOUND/INCISION CARE Most patients will experience some swelling and bruising in the area of the incisions.  Ice packs will help.  Swelling and bruising can take several days to resolve.  Unless discharge instructions indicate otherwise, follow guidelines below  STERI-STRIPS - you may remove your outer bandages 48 hours after surgery, and you may shower at that time.  You have steri-strips (small skin tapes) in place directly over the incision.  These strips should be left on the skin for 7-10 days.   DERMABOND/SKIN GLUE - you may shower in 24 hours.  The glue will flake off over the next 2-3 weeks. Any sutures or staples will be removed at the office during your follow-up visit.  ACTIVITIES You may resume regular (light) daily activities beginning the next day--such as daily self-care, walking, climbing stairs--gradually increasing activities as tolerated.  You may have sexual intercourse when it is comfortable.  Refrain from any heavy lifting or straining until approved by your doctor. You may drive when you are no longer taking prescription pain medication, you can comfortably wear a seatbelt, and you can safely maneuver your car and apply brakes.  FOLLOW-UP You should see your doctor in the office for a follow-up appointment approximately 2-3 weeks after your surgery.  You should have been given your post-op/follow-up appointment when your surgery was scheduled.  If you did not receive a post-op/follow-up appointment, make sure   that you call for this appointment within a day or two after you arrive home to insure a convenient appointment time.   WHEN TO CALL YOUR DOCTOR: Fever over 101.0 Inability to urinate Continued bleeding from incision. Increased pain, redness, or drainage from the  incision. Increasing abdominal pain  The clinic staff is available to answer your questions during regular business hours.  Please don't hesitate to call and ask to speak to one of the nurses for clinical concerns.  If you have a medical emergency, go to the nearest emergency room or call 911.  A surgeon from [facility] is always on call at the hospital. [address], [address], [place], [place]  [address] ? [address] [address], [place], [place]   [address] [phone] ? [phone] ? FAX [phone]  

## 2021-11-16 NOTE — Progress Notes (Signed)
? ? ?Assessment & Plan: ?Acute cholecystitis  ?Elevated LFTs, improving ?- RUQ [place] with sludge and gallbladder wall thickening suggestive of acute cholecystitis  ?- AST/ALT remain elevated, Tbili now normal ?- MRCP yesterday negative for CBD stones ?  ?FEN: NPO, IVF @125  cc/h ?VTE: ok to have SQH or LMWH from a surgical standpoint ?ID: rocephin 4/23>> ? ?Patient improved this AM.  Plan to proceed with lap chole today.  Discussed procedure with patient, including possibility of conversion to open surgery.  Patient understands and agrees to proceed. ?  ?      5/23, MD ?[facility] ?A [facility] practice ?Office: [phone] ?       ?Chief Complaint: ?Abdominal pain ? ?Subjective: ?Patient in bed, nurse at bedside.  Denies pain this AM. ? ?Objective: ?Vital signs in last 24 hours: ?Temp:  [98.5 ?F (36.9 ?C)-99.1 ?F (37.3 ?C)] 98.6 ?F (37 ?C) (04/25 0602) ?Pulse Rate:  [59-80] 65 (04/25 0602) ?Resp:  [14-18] 18 (04/25 0602) ?BP: (121-150)/(77-94) 124/84 (04/25 0602) ?SpO2:  [95 %-98 %] 98 % (04/25 0602) ?Last BM Date : 11/15/21 ? ?Intake/Output from previous day: ?04/24 0701 - 04/25 0700 ?In: 2913.8 [P.O.:1040; I.V.:1873.8] ?Out: 1550 [Urine:1550] ?Intake/Output this shift: ?No intake/output data recorded. ? ?Physical Exam: ?HEENT - sclerae clear, mucous membranes moist ?Neck - soft ?Abdomen - soft, obese; minimal RUQ tenderness; no mass ?Ext - no edema, non-tender ?Neuro - alert & oriented, no focal deficits ? ?Lab Results:  ?Recent Labs  ?  11/15/21 ?11/17/21 11/16/21 ?0431  ?WBC 9.8 6.8  ?HGB 12.2* 11.7*  ?HCT 36.8* 35.5*  ?PLT 176 151  ? ?BMET ?Recent Labs  ?  11/15/21 ?11/17/21 11/16/21 ?0431  ?NA 138 140  ?K 4.0 3.6  ?CL 109 109  ?CO2 23 25  ?GLUCOSE 125* 98  ?BUN 11 8  ?CREATININE 0.52* 0.63  ?CALCIUM 8.3* 8.5*  ? ?PT/INR ?Recent Labs  ?  11/15/21 ?11/17/21  ?LABPROT 13.3  ?INR 1.0  ? ?Comprehensive Metabolic Panel: ?   ?Component Value Date/Time  ? NA 140 11/16/2021 0431  ? NA 138 11/15/2021 0514  ? K  3.6 11/16/2021 0431  ? K 4.0 11/15/2021 0514  ? CL 109 11/16/2021 0431  ? CL 109 11/15/2021 0514  ? CO2 25 11/16/2021 0431  ? CO2 23 11/15/2021 0514  ? BUN 8 11/16/2021 0431  ? BUN 11 11/15/2021 0514  ? CREATININE 0.63 11/16/2021 0431  ? CREATININE 0.52 (L) 11/15/2021 0514  ? GLUCOSE 98 11/16/2021 0431  ? GLUCOSE 125 (H) 11/15/2021 0514  ? CALCIUM 8.5 (L) 11/16/2021 0431  ? CALCIUM 8.3 (L) 11/15/2021 0514  ? AST 202 (H) 11/16/2021 0431  ? AST 329 (H) 11/15/2021 0514  ? ALT 451 (H) 11/16/2021 0431  ? ALT 482 (H) 11/15/2021 0514  ? ALKPHOS 305 (H) 11/16/2021 0431  ? ALKPHOS 353 (H) 11/15/2021 0514  ? BILITOT 1.0 11/16/2021 0431  ? BILITOT 2.3 (H) 11/15/2021 0514  ? PROT 6.5 11/16/2021 0431  ? PROT 7.0 11/15/2021 0514  ? ALBUMIN 3.4 (L) 11/16/2021 0431  ? ALBUMIN 3.6 11/15/2021 0514  ? ? ?Studies/Results: ?CT Abdomen Pelvis W Contrast ? ?Result Date: 11/14/2021 ?CLINICAL DATA:  Abdominal pain, nausea, recent umbilical hernia surgery EXAM: CT ABDOMEN AND PELVIS WITH CONTRAST TECHNIQUE: Multidetector CT imaging of the abdomen and pelvis was performed using the standard protocol following bolus administration of intravenous contrast. RADIATION DOSE REDUCTION: This exam was performed according to the departmental dose-optimization program which includes automated exposure control, adjustment  of the mA and/or kV according to patient size and/or use of iterative reconstruction technique. CONTRAST:  OMNIPAQUE IOHEXOL 300 MG/ML  SOLN COMPARISON:  None. FINDINGS: Lower chest: Lung bases are clear. Hepatobiliary: Liver is within normal limits. Gallbladder is unremarkable. No intrahepatic or extrahepatic ductal dilatation. Pancreas: Within normal limits. Spleen: Within normal limits. Adrenals/Urinary Tract: Adrenal glands are within normal limits. Kidneys are within normal limits.  No hydronephrosis. Bladder is within normal limits. Stomach/Bowel: Stomach is within normal limits. No evidence of bowel obstruction. Normal  appendix (series 2/image 65). No colonic wall thickening or inflammatory changes. Vascular/Lymphatic: No evidence of abdominal aortic aneurysm. Small upper abdominal lymph nodes measuring up to 12 mm short axis (series 2/image 25), likely reactive. Reproductive: Prostate is unremarkable. Other: No abdominopelvic ascites. Postsurgical changes related to prior umbilical hernia repair. Associated cutaneous thickening with phlegmonous change/stranding (series 2/images 62-63), suspicious for cellulitis, with subcutaneous abscess considered less likely. Musculoskeletal: Mild degenerative changes at L5-S1. IMPRESSION: Phlegmonous changes in the periumbilical region favors cellulitis, with subcutaneous abscess considered less likely. No evidence of bowel obstruction. Electronically Signed   By: Charline Bills M.D.   On: 11/14/2021 20:49  ? ?MR 3D Recon At Scanner ? ?Result Date: 11/15/2021 ?CLINICAL DATA:  Abdominal pain EXAM: MRI ABDOMEN WITHOUT AND WITH CONTRAST (INCLUDING MRCP) TECHNIQUE: Multiplanar multisequence MR imaging of the abdomen was performed both before and after the administration of intravenous contrast. Heavily T2-weighted images of the biliary and pancreatic ducts were obtained, and three-dimensional MRCP images were rendered by post processing. CONTRAST:  61mL GADAVIST GADOBUTROL 1 MMOL/ML IV SOLN COMPARISON:  CT abdomen and pelvis dated November 14, 2021 FINDINGS: Lower chest: No acute findings. Hepatobiliary: No mass or other parenchymal abnormality identified.Nondistended gallbladder with irregular wall thickening and mucosal hyperenhancement. No biliary ductal dilation. Pancreas: No mass, inflammatory changes, or other parenchymal abnormality identified. Spleen:  Within normal limits in size and appearance. Adrenals/Urinary Tract: No masses identified. No evidence of hydronephrosis. Stomach/Bowel: Visualized portions within the abdomen are unremarkable. Vascular/Lymphatic: Mildly enlarged periportal  lymph nodes which are likely reactive. Reference node measuring 1.0 cm in short axis on series 1, image 31. No abdominal aortic aneurysm demonstrated. Other:  None. Musculoskeletal: No suspicious bone lesions identified. IMPRESSION: Nondistended gallbladder with irregular wall thickening and mucosal hyperenhancement, findings are nonspecific and could be due to acute cholecystitis or secondary to systemic process such as hepatocellular disease. Recommend HIDA scan for further evaluation. Electronically Signed   By: Allegra Lai M.D.   On: 11/15/2021 10:36  ? ?MR ABDOMEN MRCP W WO CONTAST ? ?Result Date: 11/15/2021 ?CLINICAL DATA:  Abdominal pain EXAM: MRI ABDOMEN WITHOUT AND WITH CONTRAST (INCLUDING MRCP) TECHNIQUE: Multiplanar multisequence MR imaging of the abdomen was performed both before and after the administration of intravenous contrast. Heavily T2-weighted images of the biliary and pancreatic ducts were obtained, and three-dimensional MRCP images were rendered by post processing. CONTRAST:  4mL GADAVIST GADOBUTROL 1 MMOL/ML IV SOLN COMPARISON:  CT abdomen and pelvis dated November 14, 2021 FINDINGS: Lower chest: No acute findings. Hepatobiliary: No mass or other parenchymal abnormality identified.Nondistended gallbladder with irregular wall thickening and mucosal hyperenhancement. No biliary ductal dilation. Pancreas: No mass, inflammatory changes, or other parenchymal abnormality identified. Spleen:  Within normal limits in size and appearance. Adrenals/Urinary Tract: No masses identified. No evidence of hydronephrosis. Stomach/Bowel: Visualized portions within the abdomen are unremarkable. Vascular/Lymphatic: Mildly enlarged periportal lymph nodes which are likely reactive. Reference node measuring 1.0 cm in short axis on series 1, image  31. No abdominal aortic aneurysm demonstrated. Other:  None. Musculoskeletal: No suspicious bone lesions identified. IMPRESSION: Nondistended gallbladder with irregular  wall thickening and mucosal hyperenhancement, findings are nonspecific and could be due to acute cholecystitis or secondary to systemic process such as hepatocellular disease. Recommend HIDA scan for furthe

## 2021-11-16 NOTE — Progress Notes (Signed)
?  Progress Note ?Patient: Chris Hamilton [id number] DOB: Mar 26, 1976 DOA: 11/14/2021  ?DOS: the patient was seen and examined on 11/16/2021 ? ?Brief hospital course: ?46 year old male with past medical history of intravenous drug abuse (heroin, fentanyl, methamphetamine see [facility] admission in 2021) and recent open repair of incarcerated 2.5 cm umbilical hernia on [id number] performed by Dr. Romana Juniper with general surgery presenting to [place] emergency department with complaints of abdominal pain. ?Found to have elevated LFTs with concern for acute cholecystitis. ?General surgery and GI both consulted. ?S/P lap cholecystectomy on 4/25. ? ?Assessment and Plan: ?* Acute cholecystitis ?S/P lap cholecystectomy 4/25 by Dr. Harlow Asa ?Presents with 1 week complaint of abdominal pain. ?RUQ ultrasound concerning for gallbladder sludge and gallbladder thickening. ?CT abdomen also makes comments about possible cellulitis although clinically no evidence of cellulitis or infection at the surgical site around umbilicus. ?Currently on IV antibiotics. ?General surgery and GI both consulted. ?MRCP negative for any CBD stone but shows gallbladder wall thickening. ?LFT trending up. ?Underwent laparoscopic cholecystectomy. ?We will follow-up on recommendations from surgery. ? ?Abnormal LFTs ?Notable elevation of AST, ALT, alkaline phosphatase and direct bilirubin on initial hepatic function panel ?Considering the patient's presentation and ultrasound findings this gives the appearance of a cholestatic picture ?Hepatitis panel sent.  Still pending. ?HIV negative. ?Less likely hepatitis given level of LFTs. ? ?History of drug abuse in remission [facility]) ?Patient reports being abstinent of all illicit drugs for [id number] years ? ?Obesity (BMI 30-39.9) ?Placing the patient at high risk of poor outcome. ?Body mass index is 34.67 kg/m?. ? ?Subjective: No nausea no vomiting.  Seen after the surgery.  Continues to have some  abdominal pain.  Hungry and excited to eat. ? ?Physical Exam: ?Vitals:  ? 11/16/21 1327 11/16/21 1426 11/16/21 1532 11/16/21 1652  ?BP: (!) 129/91 (!) 145/91 (!) 140/102 121/86  ?Pulse: 77 71 100 96  ?Resp: 15 16 15 15   ?Temp: 98.6 ?F (37 ?C) 98.4 ?F (36.9 ?C) (!) 97.5 ?F (36.4 ?C) 98.7 ?F (37.1 ?C)  ?TempSrc: Oral Oral Oral Oral  ?SpO2: 95% 95% 95% 95%  ?Weight:      ?Height:      ? ?General: Appear in mild distress; no visible Abnormal Neck Mass Or lumps, Conjunctiva normal ?Cardiovascular: S1 and S2 Present, no Murmur, ?Respiratory: good respiratory effort, Bilateral Air entry present and CTA, no Crackles, no wheezes ?Abdomen: Bowel Sound present, pain at the surgical incision sites ?Extremities: no Pedal edema ?Neurology: alert and oriented to time, place, and person ?Gait not checked due to patient safety concerns  ? ?Data Reviewed: ?I have Reviewed nursing notes, Vitals, and Lab results since pt's last encounter. Pertinent lab results CBC and CMP ?I have ordered test including CBC and CMP ?I have reviewed the last note from general surgery,   ? ?Family Communication: None at bedside ? ?Disposition: ?Status is: Inpatient ?Remains inpatient appropriate because: Awaiting postop recovery.  Likely can go home on 4/26 in the morning. ? ?Author: ?Berle Mull, MD ?11/16/2021 6:52 PM ? ?Please look on [email or web address] to find out who is on call. ?

## 2021-11-17 ENCOUNTER — Encounter (HOSPITAL_COMMUNITY): Payer: Self-pay | Admitting: Surgery

## 2021-11-17 DIAGNOSIS — K810 Acute cholecystitis: Secondary | ICD-10-CM | POA: Diagnosis not present

## 2021-11-17 LAB — HEPATITIS PANEL, ACUTE

## 2021-11-17 LAB — SURGICAL PATHOLOGY

## 2021-11-17 MED ORDER — IBUPROFEN 800 MG PO TABS: 800.0000 mg | ORAL_TABLET | Freq: Three times a day (TID) | ORAL | 0 refills | Status: DC | PRN

## 2021-11-17 MED ORDER — ACETAMINOPHEN 500 MG PO TABS: 1000.0000 mg | ORAL_TABLET | Freq: Four times a day (QID) | ORAL | Status: DC | PRN

## 2021-11-17 MED ORDER — HYDROMORPHONE HCL 1 MG/ML IJ SOLN
0.5000 mg | INTRAMUSCULAR | Status: DC | PRN
  Administered 2021-11-17: 0.5 mg via INTRAVENOUS
  Filled 2021-11-17: qty 0.5

## 2021-11-17 MED ORDER — OXYCODONE HCL 5 MG PO TABS: 5.0000 mg | ORAL_TABLET | ORAL | 0 refills | Status: DC | PRN

## 2021-11-17 NOTE — Progress Notes (Signed)
? ?  Progress Note ? ?1 Day Post-Op  ?Subjective: ?Expected post-operative pain. Denies n/v. Tolerating diet. Has already had a BM this AM. Reviewed post-op instructions and follow up. Patient wants to go home.  ? ?Objective: ?Vital signs in last 24 hours: ?Temp:  [97.5 ?F (36.4 ?C)-99 ?F (37.2 ?C)] 98.3 ?F (36.8 ?C) (04/26 8250) ?Pulse Rate:  [64-100] 64 (04/26 0609) ?Resp:  [15-20] 20 (04/26 5397) ?BP: (121-148)/(80-102) 147/95 (04/26 6734) ?SpO2:  [93 %-98 %] 96 % (04/26 0609) ?Last BM Date : 11/16/21 ? ?Intake/Output from previous day: ?04/25 0701 - 04/26 0700 ?In: 2547.7 [P.O.:640; I.V.:1801.1; IV Piggyback:106.6] ?Out: 10 [Blood:10] ?Intake/Output this shift: ?Total I/O ?In: 120 [P.O.:120] ?Out: 0  ? ?PE: ?General: pleasant, WD, overweight male who is laying in bed in NAD ?HEENT:  Sclera are anicteric.   ?Heart: regular, rate, and rhythm.   ?Lungs:  Respiratory effort nonlabored ?Abd: soft, appropriately ttp, ND, +BS, incisions C/D/I ?Psych: A&Ox3 with an appropriate affect. ? ? ?Lab Results:  ?Recent Labs  ?  11/15/21 ?1937 11/16/21 ?0431  ?WBC 9.8 6.8  ?HGB 12.2* 11.7*  ?HCT 36.8* 35.5*  ?PLT 176 151  ? ?BMET ?Recent Labs  ?  11/15/21 ?9024 11/16/21 ?0431  ?NA 138 140  ?K 4.0 3.6  ?CL 109 109  ?CO2 23 25  ?GLUCOSE 125* 98  ?BUN 11 8  ?CREATININE 0.52* 0.63  ?CALCIUM 8.3* 8.5*  ? ?PT/INR ?Recent Labs  ?  11/15/21 ?0973  ?LABPROT 13.3  ?INR 1.0  ? ?CMP  ?   ?Component Value Date/Time  ? NA 140 11/16/2021 0431  ? K 3.6 11/16/2021 0431  ? CL 109 11/16/2021 0431  ? CO2 25 11/16/2021 0431  ? GLUCOSE 98 11/16/2021 0431  ? BUN 8 11/16/2021 0431  ? CREATININE 0.63 11/16/2021 0431  ? CALCIUM 8.5 (L) 11/16/2021 0431  ? PROT 6.5 11/16/2021 0431  ? ALBUMIN 3.4 (L) 11/16/2021 0431  ? AST 202 (H) 11/16/2021 0431  ? ALT 451 (H) 11/16/2021 0431  ? ALKPHOS 305 (H) 11/16/2021 0431  ? BILITOT 1.0 11/16/2021 0431  ? GFRNONAA >60 11/16/2021 0431  ? ?Lipase  ?   ?Component Value Date/Time  ? LIPASE 27 11/14/2021 1740   ? ? ? ? ? ?Studies/Results: ?No results found. ? ?Anti-infectives: ?Anti-infectives (From admission, onward)  ? ? Start     Dose/Rate Route Frequency Ordered Stop  ? 11/15/21 2230  cefTRIAXone (ROCEPHIN) 2 g in sodium chloride 0.9 % 100 mL IVPB  Status:  Discontinued       ? 2 g ?200 mL/hr over 30 Minutes Intravenous Every 24 hours 11/15/21 0504 11/16/21 1333  ? 11/14/21 2230  cefTRIAXone (ROCEPHIN) 2 g in sodium chloride 0.9 % 100 mL IVPB       ? 2 g ?200 mL/hr over 30 Minutes Intravenous  Once 11/14/21 2220 11/14/21 2336  ? ?  ? ? ? ?Assessment/Plan ?Acute cholecystitis  ?Elevated LFTs with hyperbilirubinemia  ?POD1 s/p laparoscopic cholecystectomy 11/16/21 Dr. Gerrit Friends ?- incisions c/d/I ?- pain reasonably well controlled ?- tolerating diet and having bowel function  ?- stable for discharge from a surgical standpoint ?  ?FEN: Reg diet ?VTE: ok to have SQH or LMWH from a surgical standpoint ?ID: rocephin 4/23>4/25 ? LOS: 2 days  ? ? ? ?Juliet Rude, PA-C ?[facility] ?11/17/2021, 11:00 AM ?Please see Amion for pager number during day hours 7:00am-4:30pm ? ?

## 2021-11-17 NOTE — Plan of Care (Signed)
?  Problem: Education: ?Goal: Knowledge of General Education information will improve ?Description: Including pain rating scale, medication(s)/side effects and non-pharmacologic comfort measures ?Outcome: Adequate for Discharge ?  ?Problem: Health Behavior/Discharge Planning: ?Goal: Ability to manage health-related needs will improve ?Outcome: Adequate for Discharge ?  ?Problem: Clinical Measurements: ?Goal: Ability to maintain clinical measurements within normal limits will improve ?Outcome: Adequate for Discharge ?Goal: Will remain free from infection ?Outcome: Adequate for Discharge ?Goal: Diagnostic test results will improve ?Outcome: Adequate for Discharge ?Goal: Respiratory complications will improve ?Outcome: Adequate for Discharge ?Goal: Cardiovascular complication will be avoided ?Outcome: Adequate for Discharge ?  ?Problem: Nutrition: ?Goal: Adequate nutrition will be maintained ?Outcome: Adequate for Discharge ?  ?Problem: Coping: ?Goal: Level of anxiety will decrease ?Outcome: Adequate for Discharge ?  ?Problem: Pain Managment: ?Goal: General experience of comfort will improve ?Outcome: Adequate for Discharge ?  ?Problem: Safety: ?Goal: Ability to remain free from injury will improve ?Outcome: Adequate for Discharge ?  ?Problem: Skin Integrity: ?Goal: Risk for impaired skin integrity will decrease ?Outcome: Adequate for Discharge ?  ?

## 2021-11-17 NOTE — Progress Notes (Signed)
[facility] Progress Note ? ?Chris Hamilton 46 y.o. 06/18/1976 ? ?CC:  RUQ pain, elevated LFTs ? ? ?Subjective: ?Patient seen and examined laying comfortably in bed.  Patient notes he had abdominal pain this morning but was relieved with Dilaudid.  He is on normal diet and tolerating well.  Status post laparoscopic cholecystectomy 4/25.  He had 1 bowel movement this morning, normal consistency, no blood. ? ? ?ROS : Review of Systems  ?Constitutional:  Negative for chills and fever.  ?Gastrointestinal:  Positive for abdominal pain. Negative for blood in stool, constipation, diarrhea, heartburn, melena, nausea and vomiting.  ?Genitourinary:  Negative for dysuria and frequency.  ? ? ? ?Objective: ?Vital signs in last 24 hours: ?Vitals:  ? 11/16/21 2129 11/17/21 0609  ?BP: (!) 148/97 (!) 147/95  ?Pulse: 90 64  ?Resp: 16 20  ?Temp: 99 ?F (37.2 ?C) 98.3 ?F (36.8 ?C)  ?SpO2: 98% 96%  ? ? ?Physical Exam: ? ?General:  Alert, cooperative, no distress, appears stated age  ?Head:  Normocephalic, without obvious abnormality, atraumatic  ?Eyes:  Anicteric sclera, EOM's intact  ?Lungs:   Clear to auscultation bilaterally, respirations unlabored  ?Heart:  Regular rate and rhythm, S1, S2 normal  ?Abdomen:   Soft, non-tender, bowel sounds active all four quadrants,  no masses, mild distension  ?Extremities: Extremities normal, atraumatic, no  edema  ?Pulses: 2+ and symmetric  ? ? ?Lab Results: ?Recent Labs  ?  11/15/21 ?[id number] 11/16/21 ?0431  ?NA 138 140  ?K 4.0 3.6  ?CL 109 109  ?CO2 23 25  ?GLUCOSE 125* 98  ?BUN 11 8  ?CREATININE 0.52* 0.63  ?CALCIUM 8.3* 8.5*  ? ?Recent Labs  ?  11/15/21 ?[id number] 11/16/21 ?0431  ?AST 329* 202*  ?ALT 482* 451*  ?ALKPHOS 353* 305*  ?BILITOT 2.3* 1.0  ?PROT 7.0 6.5  ?ALBUMIN 3.6 3.4*  ? ?Recent Labs  ?  11/15/21 ?[id number] 11/16/21 ?0431  ?WBC 9.8 6.8  ?NEUTROABS 7.9*  --   ?HGB 12.2* 11.7*  ?HCT 36.8* 35.5*  ?MCV 85.2 86.0  ?PLT 176 151  ? ?Recent Labs  ?  11/15/21 ?[id number]  ?LABPROT 13.3  ?INR  1.0  ? ? ? ? ?Assessment ?Acute Cholecystitis ?Elevated LFTs ?RUQ pain ?  ?HGB 11.7(12.2) Platelets 151 ?AST [id number]) ALT 451(482)  ?Alkphos [id number]) TBili 1.0(2.3) ? ?Patient is healing well after laparoscopic cholecystectomy on 4/25. ?  ?Lipase 27 ?Hepatitis panel pending ?  ?Acute cholecystitis with LFTs and bilirubin that are continuing to elevate.  Possible choledocholithiasis.  White blood cell count continues to be stable and within normal range.  ?  ?Nausea and pain well controlled with Dilaudid and Zofran as needed. ?  ?Noted 1 episode of streak of bright red blood on toilet paper a few days ago.  Hemoglobin stable at this time, findings not concerning for acute GI bleed.  We will continue to trend hemoglobin.  Recommend outpatient follow-up with GI. ?  ?CT AP w/ contrast ?Gallbladder is unremarkable. No intrahepatic or extrahepatic ductal ?dilatation. ?Phlegmonous changes in the periumbilical region favors cellulitis, ?with subcutaneous abscess considered less likely. ?No evidence of bowel obstruction. ?  ?RUQ [place]  ?Layering gallbladder sludge with abnormal thickening and hyperemia ?of the gallbladder wall. In the correct clinical settings these ?findings are supportive of acute cholecystitis. ? ?MRCP ?Nondistended gallbladder with irregular wall thickening and mucosal ?hyperenhancement, findings are nonspecific and could be due to acute ?cholecystitis or secondary to systemic process such as ?hepatocellular disease. ? ?Plan: ?Continue supportive  care ?Consider follow-up labs outpatient to trend hepatic panel ?[facility] will sign off ? ?Charlott Rakes PA-C ?11/17/2021, 11:04 AM ? ?Contact #  [phone]  ?

## 2021-11-17 NOTE — Discharge Summary (Signed)
Physician Discharge Summary  ?Chris Hamilton ZDG:[id number] DOB: 1976-05-21 DOA: 11/14/2021 ? ?PCP: Patient, No Pcp Per (Inactive) ? ?Admit date: 11/14/2021 ?Discharge date: 11/17/2021 ? ?Admitted From: Home ?Disposition:  Home ? ?Discharge Condition:Stable ?CODE STATUS:FULL ?Diet recommendation: Heart Healthy   ? ?Brief/Interim Summary: ?46 year old male with past medical history of intravenous drug abuse (heroin, fentanyl, methamphetamine see [facility] admission in 2021) and recent open repair of incarcerated 2.5 cm umbilical hernia on 09/22/2021 performed by Dr. Phylliss Blakes with general surgery presenting to med [facility] emergency department with complaints of abdominal pain. ?Found to have elevated LFTs with concern for acute cholecystitis. ?General surgery and GI both consulted.S/P lap cholecystectomy on 4/25.  General surgery cleared for discharge.  Medically stable for discharge today. ? ?Following problems were addressed during his hospitalization: ? ? ?Acute cholecystitis ?S/P lap cholecystectomy 4/25 by Dr. Gerrit Friends ?Presents with 1 week complaint of abdominal pain. ?RUQ ultrasound concerning for gallbladder sludge and gallbladder thickening. ?CT abdomen commented  about possible cellulitis although clinically no evidence of cellulitis or infection at the surgical site around umbilicus. ?General surgery and GI both consulted. ?MRCP negative for any CBD stone but shows gallbladder wall thickening. ?Underwent laparoscopic cholecystectomy. ?General surgery cleared for discharge ?  ?Abnormal LFTs ?Notable elevation of AST, ALT, alkaline phosphatase and direct bilirubin on initial hepatic function panel.  Improving.  Anticipate further improvement.  Do LFT test in a week ? ?History of drug abuse in remission [facility]) ?Patient reports being abstinent of all illicit drugs for 1-1/2 years ?  ?Obesity (BMI 30-39.9) ?Body mass index is 34.67 kg/m?. ?  ? ? ?Discharge Diagnoses:  ?Principal Problem: ?  Acute  cholecystitis ?Active Problems: ?  Abnormal LFTs ?  Abnormal abdominal CT scan ?  History of drug abuse in remission [facility]) ?  Obesity (BMI 30-39.9) ? ? ? ?Discharge Instructions ? ?Discharge Instructions   ? ? Diet general   Complete by: As directed ?  ? Discharge instructions   Complete by: As directed ?  ? 1)Please follow-up with your PCP in a week and do a liver function test to check your liver enzymes in a week  ?2)please follow up with general surgery as an outpatient  ? Increase activity slowly   Complete by: As directed ?  ? ?  ? ?Allergies as of 11/17/2021   ?No Known Allergies ?  ? ?  ?Medication List  ?  ? ?TAKE these medications   ? ?acetaminophen 500 MG tablet ?Commonly known as: TYLENOL ?Take 2 tablets (1,000 mg total) by mouth every 6 (six) hours as needed for mild pain or fever. ?  ?ibuprofen 800 MG tablet ?Commonly known as: ADVIL ?Take 1 tablet (800 mg total) by mouth every 8 (eight) hours as needed. ?What changed: reasons to take this ?  ?oxyCODONE 5 MG immediate release tablet ?Commonly known as: Oxy IR/ROXICODONE ?Take 1 tablet (5 mg total) by mouth every 4 (four) hours as needed for moderate pain or severe pain. ?  ? ?  ? ? Follow-up Information   ? ? Surgery, [place]. Call in 1 day(s).   ?Specialty: General Surgery ?Why: Please call to confirm your appointment date and time. We are working hard to make this for you. Please bring a copy of your photo ID and insurance card. Please arrive 30 minutes prior to your appointment for paperwork. ?Contact information: ?[address] ?[address] ?[place] [address] ?[phone] ? ? ?  ?  ? ?  ?  ? ?  ? ?  No Known Allergies ? ?Consultations: ?General surgery, GI ? ? ?Procedures/Studies: ?CT Abdomen Pelvis W Contrast ? ?Result Date: 11/14/2021 ?CLINICAL DATA:  Abdominal pain, nausea, recent umbilical hernia surgery EXAM: CT ABDOMEN AND PELVIS WITH CONTRAST TECHNIQUE: Multidetector CT imaging of the abdomen and pelvis was performed using the  standard protocol following bolus administration of intravenous contrast. RADIATION DOSE REDUCTION: This exam was performed according to the departmental dose-optimization program which includes automated exposure control, adjustment of the mA and/or kV according to patient size and/or use of iterative reconstruction technique. CONTRAST:  100mL OMNIPAQUE IOHEXOL 300 MG/ML  SOLN COMPARISON:  None. FINDINGS: Lower chest: Lung bases are clear. Hepatobiliary: Liver is within normal limits. Gallbladder is unremarkable. No intrahepatic or extrahepatic ductal dilatation. Pancreas: Within normal limits. Spleen: Within normal limits. Adrenals/Urinary Tract: Adrenal glands are within normal limits. Kidneys are within normal limits.  No hydronephrosis. Bladder is within normal limits. Stomach/Bowel: Stomach is within normal limits. No evidence of bowel obstruction. Normal appendix (series 2/image 65). No colonic wall thickening or inflammatory changes. Vascular/Lymphatic: No evidence of abdominal aortic aneurysm. Small upper abdominal lymph nodes measuring up to 12 mm short axis (series 2/image 25), likely reactive. Reproductive: Prostate is unremarkable. Other: No abdominopelvic ascites. Postsurgical changes related to prior umbilical hernia repair. Associated cutaneous thickening with phlegmonous change/stranding (series 2/images 62-63), suspicious for cellulitis, with subcutaneous abscess considered less likely. Musculoskeletal: Mild degenerative changes at L5-S1. IMPRESSION: Phlegmonous changes in the periumbilical region favors cellulitis, with subcutaneous abscess considered less likely. No evidence of bowel obstruction. Electronically Signed   By: Charline BillsSriyesh  Krishnan M.D.   On: 11/14/2021 20:49  ? ?MR 3D Recon At Scanner ? ?Result Date: 11/15/2021 ?CLINICAL DATA:  Abdominal pain EXAM: MRI ABDOMEN WITHOUT AND WITH CONTRAST (INCLUDING MRCP) TECHNIQUE: Multiplanar multisequence MR imaging of the abdomen was performed both  before and after the administration of intravenous contrast. Heavily T2-weighted images of the biliary and pancreatic ducts were obtained, and three-dimensional MRCP images were rendered by post processing. CONTRAST:  10mL GADAVIST GADOBUTROL 1 MMOL/ML IV SOLN COMPARISON:  CT abdomen and pelvis dated November 14, 2021 FINDINGS: Lower chest: No acute findings. Hepatobiliary: No mass or other parenchymal abnormality identified.Nondistended gallbladder with irregular wall thickening and mucosal hyperenhancement. No biliary ductal dilation. Pancreas: No mass, inflammatory changes, or other parenchymal abnormality identified. Spleen:  Within normal limits in size and appearance. Adrenals/Urinary Tract: No masses identified. No evidence of hydronephrosis. Stomach/Bowel: Visualized portions within the abdomen are unremarkable. Vascular/Lymphatic: Mildly enlarged periportal lymph nodes which are likely reactive. Reference node measuring 1.0 cm in short axis on series 1, image 31. No abdominal aortic aneurysm demonstrated. Other:  None. Musculoskeletal: No suspicious bone lesions identified. IMPRESSION: Nondistended gallbladder with irregular wall thickening and mucosal hyperenhancement, findings are nonspecific and could be due to acute cholecystitis or secondary to systemic process such as hepatocellular disease. Recommend HIDA scan for further evaluation. Electronically Signed   By: Allegra LaiLeah  Strickland M.D.   On: 11/15/2021 10:36  ? ?MR ABDOMEN MRCP W WO CONTAST ? ?Result Date: 11/15/2021 ?CLINICAL DATA:  Abdominal pain EXAM: MRI ABDOMEN WITHOUT AND WITH CONTRAST (INCLUDING MRCP) TECHNIQUE: Multiplanar multisequence MR imaging of the abdomen was performed both before and after the administration of intravenous contrast. Heavily T2-weighted images of the biliary and pancreatic ducts were obtained, and three-dimensional MRCP images were rendered by post processing. CONTRAST:  10mL GADAVIST GADOBUTROL 1 MMOL/ML IV SOLN COMPARISON:   CT abdomen and pelvis dated November 14, 2021 FINDINGS: Lower chest: No acute findings.  Hepatobiliary: No mass or other parenchymal abnormality identified.Nondistended gallbladder with irregular wall thickening and muco

## 2021-12-21 ENCOUNTER — Encounter: Payer: Self-pay | Admitting: Family

## 2021-12-21 ENCOUNTER — Ambulatory Visit (INDEPENDENT_AMBULATORY_CARE_PROVIDER_SITE_OTHER): Payer: BC Managed Care – PPO | Admitting: Family

## 2021-12-21 VITALS — BP 124/70 | HR 83 | Temp 97.9°F | Resp 18 | Ht 74.0 in | Wt 281.8 lb

## 2021-12-21 DIAGNOSIS — G8929 Other chronic pain: Secondary | ICD-10-CM | POA: Diagnosis not present

## 2021-12-21 DIAGNOSIS — M25562 Pain in left knee: Secondary | ICD-10-CM | POA: Diagnosis not present

## 2021-12-21 DIAGNOSIS — B1920 Unspecified viral hepatitis C without hepatic coma: Secondary | ICD-10-CM

## 2021-12-21 MED ORDER — ALBUTEROL SULFATE HFA 108 (90 BASE) MCG/ACT IN AERS: 2.0000 | INHALATION_SPRAY | Freq: Four times a day (QID) | RESPIRATORY_TRACT | 2 refills | Status: DC | PRN

## 2021-12-21 NOTE — Progress Notes (Signed)
Chris Hamilton is a 46 y.o. male with the following history as recorded in EpicCare:  Patient Active Problem List   Diagnosis Date Noted   Abnormal abdominal CT scan 11/15/2021   Abnormal LFTs 11/15/2021   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/15/2021   Obesity (BMI 30-39.9) 11/15/2021   Acute cholecystitis 11/14/2021    Current Outpatient Medications  Medication Sig Dispense Refill   acetaminophen (TYLENOL) 500 MG tablet Take 2 tablets (1,000 mg total) by mouth every 6 (six) hours as needed for mild pain or fever.     albuterol (VENTOLIN HFA) 108 (90 Base) MCG/ACT inhaler Inhale 2 puffs into the lungs every 6 (six) hours as needed for wheezing or shortness of breath. 8 g 2   ibuprofen (ADVIL) 800 MG tablet Take 1 tablet (800 mg total) by mouth every 8 (eight) hours as needed. 30 tablet 0   UNABLE TO FIND Take 2 Scoops by mouth daily. Med Name: Kratom     UNABLE TO FIND Take by mouth daily. Med Name: Tandrilax     No current facility-administered medications for this visit.    Allergies: Patient has no known allergies.  Past Medical History:  Diagnosis Date   Hepatitis C, chronic (HCC)    Incarcerated umbilical hernia    S/P repair by Dr. Fredricka Bonine 09/22/2021   IV drug abuse (HCC)    Shingles 2022    Past Surgical History:  Procedure Laterality Date   CHOLECYSTECTOMY N/A 11/16/2021   Procedure: LAPAROSCOPIC CHOLECYSTECTOMY;  Surgeon: Darnell Level, MD;  Location: [facility];  Service: General;  Laterality: N/A;   UMBILICAL HERNIA REPAIR N/A 09/22/2021   Procedure: OPEN UMBILICAL HERNIA REPAIR WITH MESH;  Surgeon: Berna Bue, MD;  Location: [facility];  Service: General;  Laterality: N/A;    Family History  Problem Relation Age of Onset   Cancer Mother    Miscarriages / [place] Mother    Cancer Father    Diabetes Maternal Grandmother    Cancer Maternal Grandmother    Heart disease Neg Hx     Social History   Tobacco Use   Smoking status:  Former    Packs/day: 1.00    Types: Cigarettes   Smokeless tobacco: Never  Substance Use Topics   Alcohol use: Not Currently    Subjective:  Presents today as a new patient;  Was recently admitted for cholecystectomy; found to have Hep C while in hospital but has not been referred to ID; history of IV drug use but has been sober for the past 1.5 years;  Chronic left knee pain- using an OTC supplement from [place] which has Tylenol in it for relief of symptoms; 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  Head: Normocephalic and atraumatic  Lungs: Respirations unlabored; clear to auscultation bilaterally without wheeze, rales, rhonchi  CVS exam: normal rate and regular rhythm.  Abdomen: Soft; nontender; nondistended; normoactive bowel sounds; no masses or hepatosplenomegaly  Musculoskeletal: No deformities; no active joint inflammation  Extremities: No edema, cyanosis, clubbing  Vessels: Symmetric bilaterally  Neurologic: Alert  and oriented; speech intact; face symmetrical; moves all extremities well; CNII-XII intact without focal deficit   Assessment:  1. Hepatitis C virus infection without hepatic coma, unspecified chronicity   2. Chronic pain of left knee     Plan:  Refer to ID to discuss treatment options; Stressed to patient to avoid Tylenol products for now; he can use OTC Aleve; refer to orthopedist; due to insurance billing issues, unable to get imaging here today;   No follow-ups on file.  Orders Placed This Encounter  Procedures   Ambulatory referral to Infectious Disease    Referral Priority:   Routine    Referral Type:   Consultation    Referral Reason:    Specialty Services Required    Requested Specialty:   Infectious Diseases    Number of Visits Requested:   1   Ambulatory referral to Orthopedic Surgery    Referral Priority:   Routine    Referral Type:   Surgical    Referral Reason:   Specialty Services Required    Requested Specialty:   Orthopedic Surgery    Number of Visits Requested:   1    Requested Prescriptions   Signed Prescriptions Disp Refills   albuterol (VENTOLIN HFA) 108 (90 Base) MCG/ACT inhaler 8 g 2    Sig: Inhale 2 puffs into the lungs every 6 (six) hours as needed for wheezing or shortness of breath.

## 2021-12-22 DIAGNOSIS — B182 Chronic viral hepatitis C: Secondary | ICD-10-CM | POA: Insufficient documentation

## 2021-12-22 NOTE — Progress Notes (Deleted)
Patient Name: Chris Hamilton  Date of Birth: Dec 21, 1975  MRN: [id number]  PCP: Olive Bass, FNP  Referring Provider: Olive Bass,*, Ph#: [phone]   CC:  New patient - initial evaluation and management of chronic hepatitis C infection.    HPI/ROS:  Chris Hamilton is a 46 y.o. male here for evaluation.   Tested during hospital encounter in April 2023 with reactive hepatitis c antibody and viral load 10,500. Testing was done in the setting of elevated liver function tests after he had laparoscopic cholecystectomy.  HIV test is non-reactive.   He was previously tested ***. Risk factor is prior drug use, now in remission.    Patient {does/does not:19866} have documented immunity to Hepatitis A. Patient {does/does not:19867} have documented immunity to Hepatitis B.    {ros - complete:22885} All other systems reviewed and are negative      Past Medical History:  Diagnosis Date   Hepatitis C, chronic (HCC)    Incarcerated umbilical hernia    S/P repair by Dr. Fredricka Bonine 09/22/2021   IV drug abuse (HCC)    Shingles 2022    Prior to Admission medications   Medication Sig Start Date End Date Taking? Authorizing Provider  acetaminophen (TYLENOL) 500 MG tablet Take 2 tablets (1,000 mg total) by mouth every 6 (six) hours as needed for mild pain or fever. 11/17/21   Juliet Rude, PA-C  albuterol (VENTOLIN HFA) 108 (90 Base) MCG/ACT inhaler Inhale 2 puffs into the lungs every 6 (six) hours as needed for wheezing or shortness of breath. 12/21/21   Olive Bass, FNP  ibuprofen (ADVIL) 800 MG tablet Take 1 tablet (800 mg total) by mouth every 8 (eight) hours as needed. 11/17/21   Juliet Rude, PA-C  UNABLE TO FIND Take 2 Scoops by mouth daily. Med Name: Kratom    [provider]  UNABLE TO FIND Take by mouth daily. Med Name: Tandrilax    [provider]    No Known Allergies  Social History   Tobacco Use   Smoking  status: Former    Packs/day: 1.00    Types: Cigarettes   Smokeless tobacco: Never  Vaping Use   Vaping Use: Every day   Substances: Nicotine  Substance Use Topics   Alcohol use: Not Currently   Drug use: Not Currently    Types: Methamphetamines, Heroin, Cocaine, MDMA (Ecstacy), LSD, Marijuana, Amphetamines, Oxycodone, Benzodiazepines    Comment: past meth and heroin use    Family History  Problem Relation Age of Onset   Cancer Mother    Miscarriages / Stillbirths Mother    Cancer Father    Diabetes Maternal Grandmother    Cancer Maternal Grandmother    Heart disease Neg Hx    ***  Objective:  There were no vitals filed for this visit. Constitutional: {EXAM; GENERAL APPEARANCE:5021} Eyes: anicteric Cardiovascular: {Mis exam cardio:32073} Respiratory: {Exam; lungs brief:12271} Gastrointestinal: {Exam; abdomen:5794} Musculoskeletal: {extremities:315109} Skin: {Skin exam gi:12013}; no porphyria cutanea tarda Lymphatic: no cervical lymphadenopathy   Laboratory: Genotype: No results found for: HCVGENOTYPE HCV viral load: No results found for: HCVQUANT Lab Results  Component Value Date   WBC 6.8 11/16/2021   HGB 11.7 (L) 11/16/2021   HCT 35.5 (L) 11/16/2021   MCV 86.0 11/16/2021   PLT 151 11/16/2021    Lab Results  Component Value Date   CREATININE 0.63 11/16/2021   BUN 8 11/16/2021   NA 140 11/16/2021   K 3.6 11/16/2021   CL 109  11/16/2021   CO2 25 11/16/2021    Lab Results  Component Value Date   ALT 451 (H) 11/16/2021   AST 202 (H) 11/16/2021   ALKPHOS 305 (H) 11/16/2021    Lab Results  Component Value Date   INR 1.0 11/15/2021   BILITOT 1.0 11/16/2021   ALBUMIN 3.4 (L) 11/16/2021    APRI ***   FIB-4 ***   Imaging:  ***  Assessment & Plan:   Problem List Items Addressed This Visit   None    Rexene Alberts, MSN, NP-C [facility]  [email or web address] Pager:  [phone] Office: [phone] [facility] Main Line: [phone]

## 2021-12-23 ENCOUNTER — Encounter: Payer: Self-pay | Admitting: Internal Medicine

## 2021-12-23 ENCOUNTER — Other Ambulatory Visit: Payer: Self-pay

## 2021-12-23 ENCOUNTER — Ambulatory Visit (INDEPENDENT_AMBULATORY_CARE_PROVIDER_SITE_OTHER): Payer: BC Managed Care – PPO | Admitting: Internal Medicine

## 2021-12-23 ENCOUNTER — Encounter: Payer: BC Managed Care – PPO | Admitting: Infectious Diseases

## 2021-12-23 DIAGNOSIS — F32A Depression, unspecified: Secondary | ICD-10-CM | POA: Insufficient documentation

## 2021-12-23 DIAGNOSIS — B182 Chronic viral hepatitis C: Secondary | ICD-10-CM

## 2021-12-23 DIAGNOSIS — F329 Major depressive disorder, single episode, unspecified: Secondary | ICD-10-CM | POA: Diagnosis not present

## 2021-12-23 DIAGNOSIS — Z09 Encounter for follow-up examination after completed treatment for conditions other than malignant neoplasm: Secondary | ICD-10-CM | POA: Diagnosis not present

## 2021-12-23 DIAGNOSIS — F1911 Other psychoactive substance abuse, in remission: Secondary | ICD-10-CM

## 2021-12-23 NOTE — Assessment & Plan Note (Signed)
He has recently discovered chronic hepatitis C without obvious complication.  Ultrasound and recent imaging while hospitalized for cholecystitis did not show any evidence of cirrhosis or hepatic masses.  His hepatitis C genotype and fibrosis score is pending.  My pharmacy staff reviewed his insurance coverage and see him back next week to select antiviral therapy.

## 2021-12-23 NOTE — Assessment & Plan Note (Signed)
I congratulated him on continued sobriety, especially in face of the recent stress he has been under.

## 2021-12-23 NOTE — Progress Notes (Signed)
[facility]  Reason for Consult: Chronic hepatitis C Referring Provider: Dr. Rosey Bath  Assessment: He has recently discovered chronic hepatitis C without obvious complication.  Ultrasound and recent imaging while hospitalized for cholecystitis did not show any evidence of cirrhosis or hepatic masses.  His hepatitis C genotype and fibrosis score is pending.  My pharmacy staff reviewed his insurance coverage and see him back next week to select antiviral therapy.  I told him that if his grief and depression start to overwhelm him that help is available.  I congratulated him on continued sobriety, especially in face of the recent stress he has been under.   Plan: Await results of his hepatitis C genotype and fibrosis score Follow-up in 1 week to start therapy for hepatitis C  Patient Active Problem List   Diagnosis Date Noted   Depression 12/23/2021    Priority: High   Chronic hepatitis C without hepatic coma (HCC) 12/22/2021    Priority: High   Status post umbilical hernia repair, follow-up exam 12/23/2021   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/15/2021   Obesity (BMI 30-39.9) 11/15/2021   Status post laparoscopic cholecystectomy 11/14/2021    Patient's Medications  New Prescriptions   No medications on file  Previous Medications   ACETAMINOPHEN (TYLENOL) 500 MG TABLET    Take 2 tablets (1,000 mg total) by mouth every 6 (six) hours as needed for mild pain or fever.   ALBUTEROL (VENTOLIN HFA) 108 (90 BASE) MCG/ACT INHALER    Inhale 2 puffs into the lungs every 6 (six) hours as needed for wheezing or shortness of breath.   IBUPROFEN (ADVIL) 800 MG TABLET    Take 1 tablet (800 mg total) by mouth every 8 (eight) hours as needed.   UNABLE TO FIND    Take 2 Scoops by mouth daily. Med Name: Kratom   UNABLE TO FIND    Take by mouth daily. Med Name: Tandrilax  Modified Medications   No medications on file  Discontinued Medications   No  medications on file    HPI: Chris Hamilton is a 46 y.o. male with a history of injecting drug use going back many decades.  He says that he initially did not share syringes, needles or other drug works until about 5 years ago.  He has never had any known problems with his liver.  He has never been told that he had hepatitis or yellow jaundice.  He was never screened for hepatitis C until he was recently admitted to the hospital for an emergent laparoscopic cholecystectomy.  Records indicate he was antibody positive at that point and his hepatitis C viral load was elevated at 10,500.  He has never been treated for hepatitis C.  His HIV antibody was negative.  He has no history of prior sexually transmitted disease.  He works as a Airline pilot at [employer].  He has been taking some over-the-counter medications but is not on any prescription medication.  He has never been hospitalized other than his 2 recent surgeries.  He is a former smoker.  He does not drink alcohol.  He went through drug treatment at [facility] 18 months ago and has been completely sober since that time.  He says it has been a "very rough year".  His boyfriend died suddenly of a drug overdose in January.  He has not sought any counseling or treatment for grief or depression.  Review of Systems: Review  of Systems  Constitutional:  Negative for fever and weight loss.  Gastrointestinal:  Negative for abdominal pain.  Musculoskeletal:  Positive for joint pain.  Psychiatric/Behavioral:  Positive for depression. Negative for substance abuse. The patient does not have insomnia.      Past Medical History:  Diagnosis Date   Hepatitis C, chronic (HCC)    Incarcerated umbilical hernia    S/P repair by Dr. Fredricka Bonine 09/22/2021   IV drug abuse [facility])    Shingles 2022    Social History   Tobacco Use   Smoking status: Every Day    Types: E-cigarettes   Smokeless tobacco: Never  Vaping Use   Vaping Use: Every day   Substances:  Nicotine  Substance Use Topics   Alcohol use: Not Currently   Drug use: Not Currently    Types: Methamphetamines, Heroin, Cocaine, MDMA (Ecstacy), LSD, Marijuana, Amphetamines, Oxycodone, Benzodiazepines    Comment: past meth and heroin use    Family History  Problem Relation Age of Onset   Cancer Mother    Miscarriages / Stillbirths Mother    Cancer Father    Diabetes Maternal Grandmother    Cancer Maternal Grandmother    Heart disease Neg Hx    No Known Allergies  OBJECTIVE: Vitals:   12/23/21 1108  BP: 125/82  Pulse: 90  Temp: 98.2 F (36.8 C)  TempSrc: Oral  Weight: 279 lb (126.6 kg)   Body mass index is 35.82 kg/m.   Physical Exam Constitutional:      Comments: He is pleasant and in no distress.  He did become tearful when talking about his boyfriend's recent drug overdose and death.  Cardiovascular:     Rate and Rhythm: Normal rate.  Pulmonary:     Effort: Pulmonary effort is normal.  Abdominal:     Palpations: Abdomen is soft.     Tenderness: There is no abdominal tenderness.     Comments: He has healing incisions from his recent hernia repair and laparoscopic cholecystectomy.    Microbiology: No results found for this or any previous visit (from the past 240 hour(s)).  Cliffton Asters, MD [facility] for Infectious Disease [facility] [phone] pager   [phone] cell 12/23/2021, 11:45 AM

## 2021-12-23 NOTE — Assessment & Plan Note (Signed)
I told him that if his grief and depression start to overwhelm him that help is available.

## 2021-12-24 ENCOUNTER — Ambulatory Visit (HOSPITAL_BASED_OUTPATIENT_CLINIC_OR_DEPARTMENT_OTHER): Payer: BC Managed Care – PPO | Admitting: Orthopaedic Surgery

## 2021-12-24 ENCOUNTER — Ambulatory Visit (INDEPENDENT_AMBULATORY_CARE_PROVIDER_SITE_OTHER): Payer: BC Managed Care – PPO

## 2021-12-24 DIAGNOSIS — M2392 Unspecified internal derangement of left knee: Secondary | ICD-10-CM

## 2021-12-24 DIAGNOSIS — M722 Plantar fascial fibromatosis: Secondary | ICD-10-CM

## 2021-12-24 DIAGNOSIS — M25562 Pain in left knee: Secondary | ICD-10-CM | POA: Diagnosis not present

## 2021-12-24 DIAGNOSIS — G8929 Other chronic pain: Secondary | ICD-10-CM | POA: Diagnosis not present

## 2021-12-24 IMAGING — DX DG KNEE COMPLETE 4+V*L*
4 series · 4 of 4 positions shown · non-contrast
Comparison: None Available.

CLINICAL DATA: Chronic left knee pain.

EXAM:
LEFT KNEE - COMPLETE 4+ VIEW

[knee tunnel]
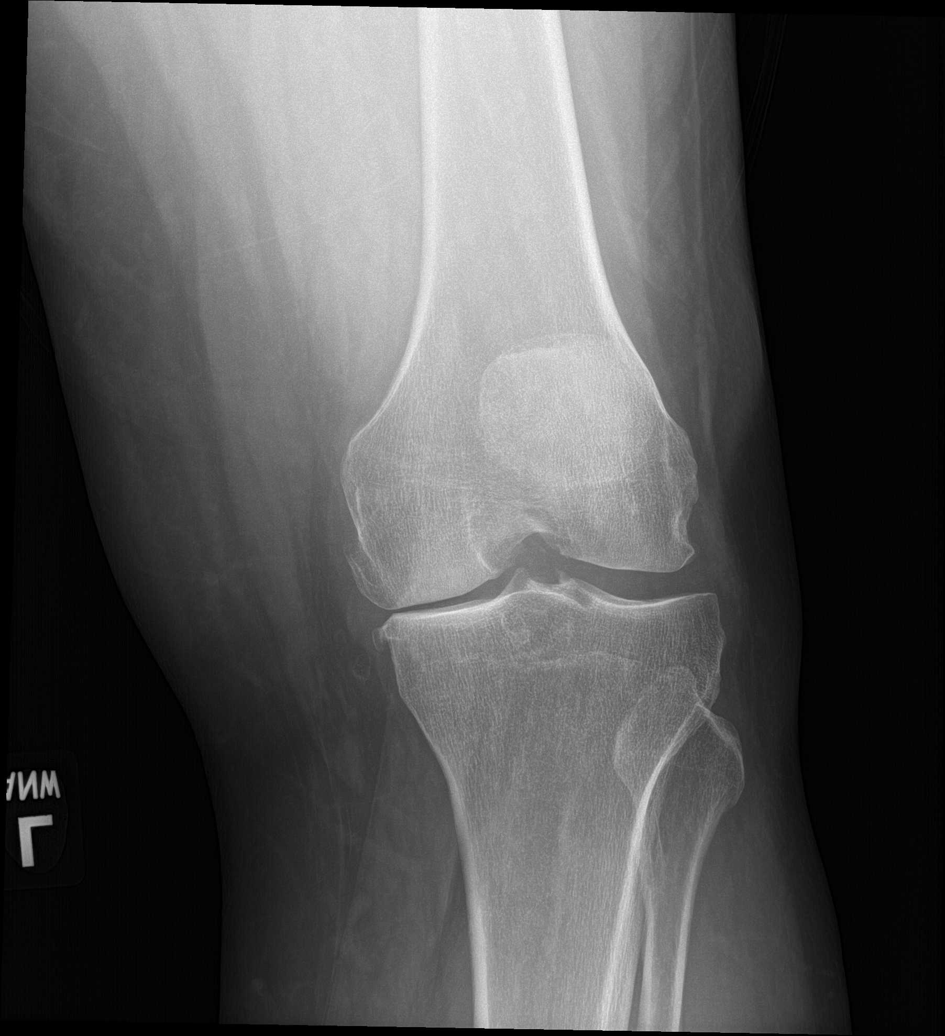

[patella sunrise]
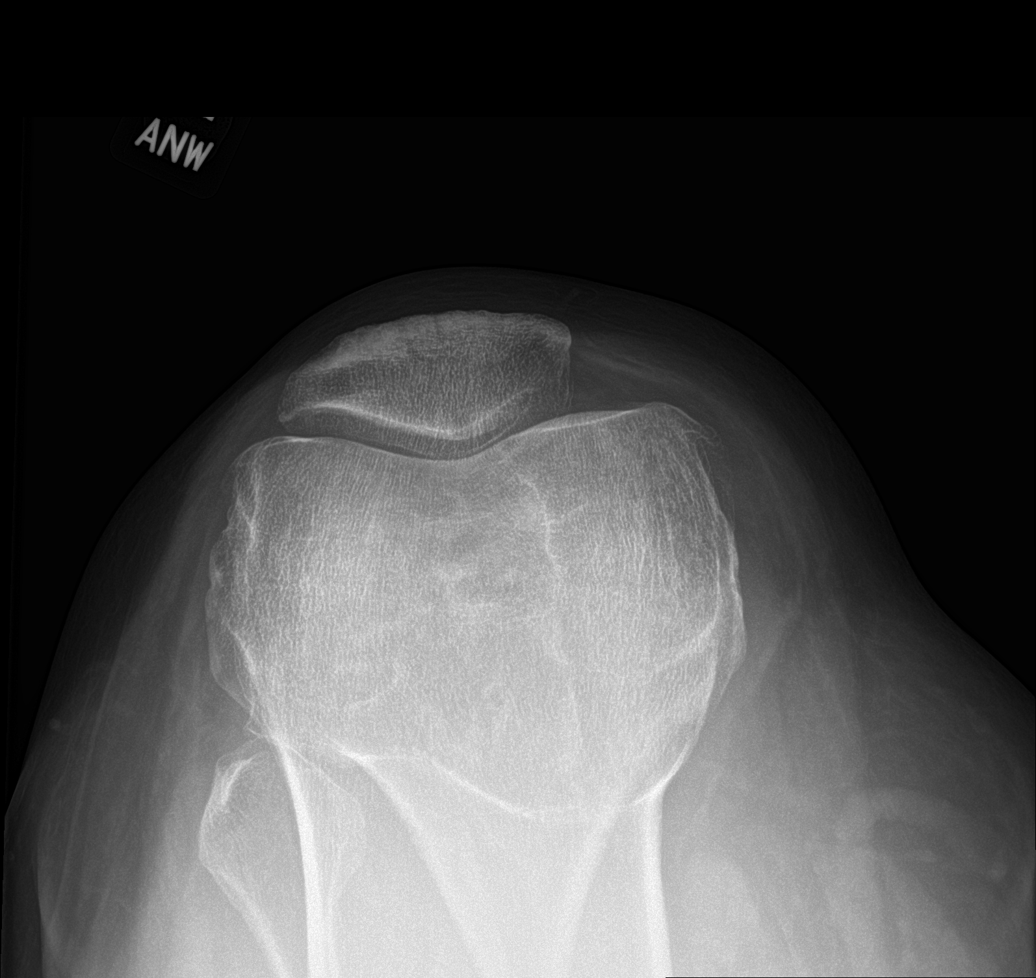

[knee ap]
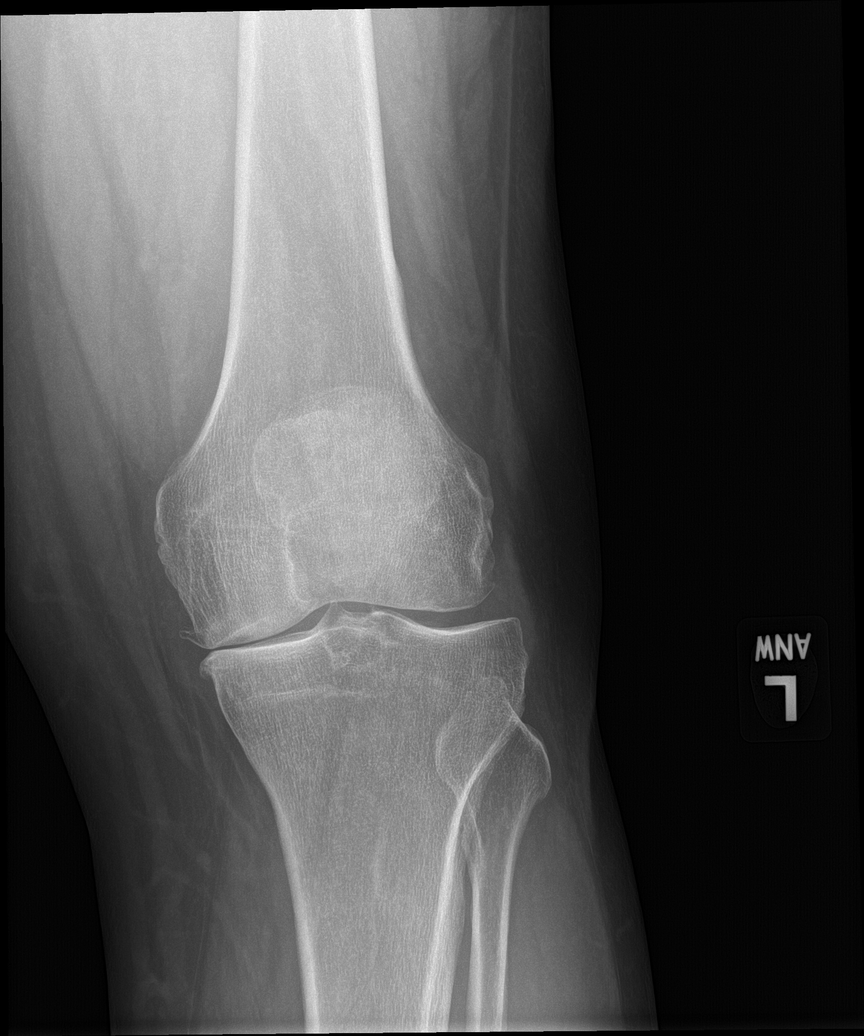

[knee lat]
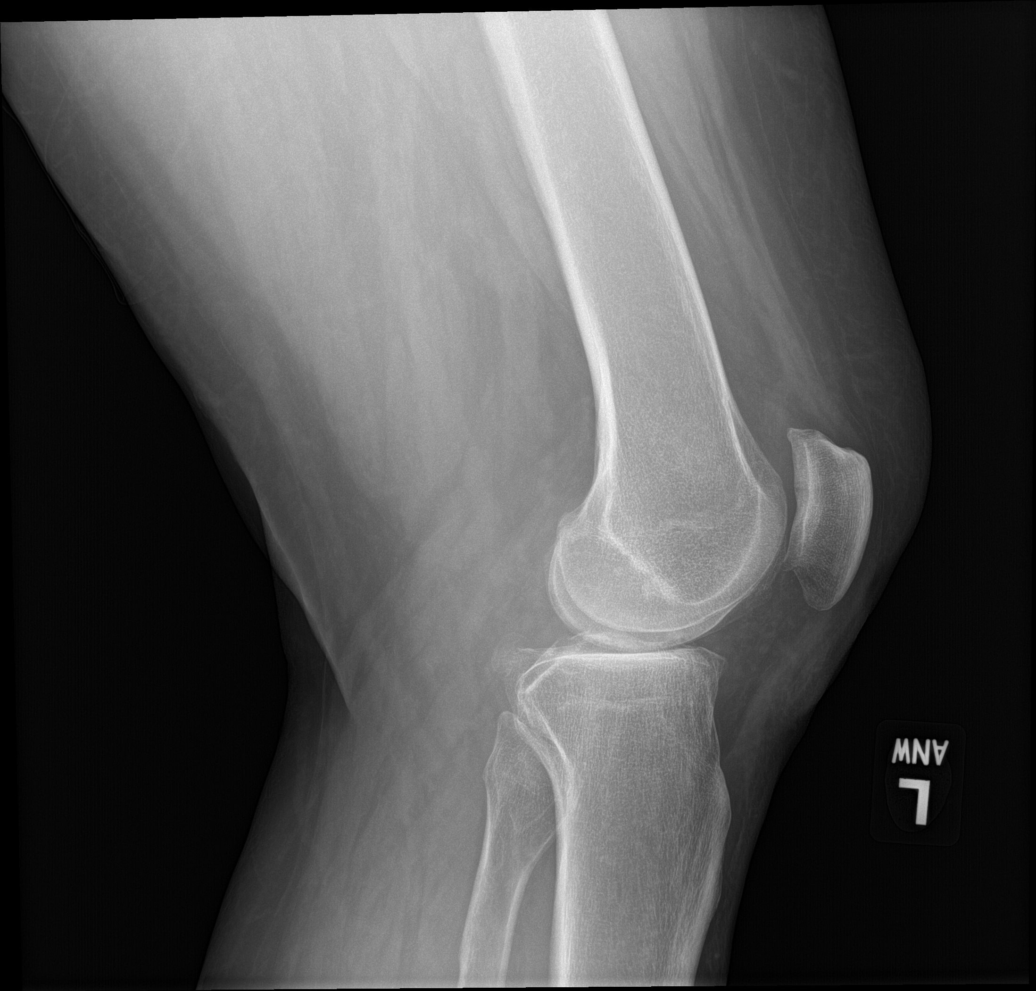

[4 of 4 positions shown; findings below may reference images not displayed]

FINDINGS: Moderate medial tibiofemoral joint space narrowing. Moderate
tricompartmental peripheral spurring most prominent in the medial
tibiofemoral compartment. Suspect small subchondral cyst in the
central patella. No fracture, erosion, or bony destructive change.
No focal bone lesion. Minimal knee joint effusion.
IMPRESSION: Tricompartmental osteoarthritis, most prominent in the medial
tibiofemoral compartment. Minimal knee joint effusion.

## 2021-12-24 NOTE — Progress Notes (Signed)
Chief Complaint: Left knee pain     History of Present Illness:    Chris Hamilton is a 46 y.o. male presents today with ongoing left knee pain.  He states that when he was 16 he was diagnosed with a meniscus tear and subsequently surgery was deferred and he did come with respiratory illness.  After that time he began experiencing some pain relief and as result the knee was not further addressed.  He does state that the knee is continue to feel unstable and painful.  He will occasionally feel pops in the knee.  He does have mechanical symptoms involving the knee.  He has not had any physical therapy.  He states that the knee continues to give out on him.  He works waiting tables locally here in [place] and does not feel stable with regard to this knee.  He is here today for further assessment.  He is also complaining of bilateral heel pain.  This is occurring on the medial aspect of the arch.  This has been going on for several months now.  He has used a frozen water bottle about of the heel to help.    Surgical History:   None  PMH/PSH/Family History/Social History/Meds/Allergies:    Past Medical History:  Diagnosis Date   Hepatitis C, chronic (HCC)    Incarcerated umbilical hernia    S/P repair by Dr. Fredricka Bonine 09/22/2021   IV drug abuse (HCC)    Shingles 2022   Past Surgical History:  Procedure Laterality Date   CHOLECYSTECTOMY N/A 11/16/2021   Procedure: LAPAROSCOPIC CHOLECYSTECTOMY;  Surgeon: Darnell Level, MD;  Location: [facility];  Service: General;  Laterality: N/A;   UMBILICAL HERNIA REPAIR N/A 09/22/2021   Procedure: OPEN UMBILICAL HERNIA REPAIR WITH MESH;  Surgeon: Berna Bue, MD;  Location: [facility] [place];  Service: General;  Laterality: N/A;   Social History   Socioeconomic History   Marital status: Single    Spouse name: Not on file   Number of children: Not on file   Years of education: Not on file   Highest  education level: Not on file  Occupational History   Not on file  Tobacco Use   Smoking status: Every Day    Types: E-cigarettes   Smokeless tobacco: Never  Vaping Use   Vaping Use: Every day   Substances: Nicotine  Substance and Sexual Activity   Alcohol use: Not Currently   Drug use: Not Currently    Types: Methamphetamines, Heroin, Cocaine, MDMA (Ecstacy), LSD, Marijuana, Amphetamines, Oxycodone, Benzodiazepines    Comment: past meth and heroin use   Sexual activity: Yes  Other Topics Concern   Not on file  Social History Narrative   Not on file   Social Determinants of Health   Financial Resource Strain: Not on file  Food Insecurity: Not on file  Transportation Needs: Not on file  Physical Activity: Not on file  Stress: Not on file  Social Connections: Not on file   Family History  Problem Relation Age of Onset   Cancer Mother    Miscarriages / [place] Mother    Cancer Father    Diabetes Maternal Grandmother    Cancer Maternal Grandmother    Heart disease Neg Hx    No Known Allergies Current Outpatient Medications  Medication  Sig Dispense Refill   acetaminophen (TYLENOL) 500 MG tablet Take 2 tablets (1,000 mg total) by mouth every 6 (six) hours as needed for mild pain or fever. (Patient not taking: Reported on 12/23/2021)     albuterol (VENTOLIN HFA) 108 (90 Base) MCG/ACT inhaler Inhale 2 puffs into the lungs every 6 (six) hours as needed for wheezing or shortness of breath. (Patient not taking: Reported on 12/23/2021) 8 g 2   ibuprofen (ADVIL) 800 MG tablet Take 1 tablet (800 mg total) by mouth every 8 (eight) hours as needed. 30 tablet 0   UNABLE TO FIND Take 2 Scoops by mouth daily. Med Name: Kratom     UNABLE TO FIND Take by mouth daily. Med Name: Juventino Slovak (Patient not taking: Reported on 12/23/2021)     No current facility-administered medications for this visit.   No results found.  Review of Systems:   A ROS was performed including pertinent positives  and negatives as documented in the HPI.  Physical Exam :   Constitutional: NAD and appears stated age Neurological: Alert and oriented Psych: Appropriate affect and cooperative There were no vitals taken for this visit.   Comprehensive Musculoskeletal Exam:      Musculoskeletal Exam  Gait Normal  Alignment Normal   Right Left  Inspection Normal Normal  Palpation    Tenderness None Medial joint  Crepitus None None  Effusion None None  Range of Motion    Extension 0 0  Flexion 135 135  Strength    Extension 5/5 5/5  Flexion 5/5 5/5  Ligament Exam     Generalized Laxity No No  Lachman Negative Negative   Pivot Shift Negative Negative  Anterior Drawer Negative Negative  Valgus at 0 Negative Negative  Valgus at 20 Negative Negative  Varus at 0 0 0  Varus at 20   0 0  Posterior Drawer at 90 0 0  Vascular/Lymphatic Exam    Edema None None  Venous Stasis Changes No No  Distal Circulation Normal Normal  Neurologic    Light Touch Sensation Intact Intact  Special Tests:      Imaging:   Xray (4 views left knee): Normal   I personally reviewed and interpreted the radiographs.   Assessment:   46 y.o. male presents today with left medial based knee pain which is consistent with a meniscal injury and possibly an old ACL injury.  To this effect I have recommended that we obtain an MRI of the left knee to further assess this.  I did describe that if he ultimately does have ligamentous instability given his job and persistent instability this may be due worthwhile to address surgically.  We will plan to begin with an MRI of this and to discuss results.  With regard to his heel I discussed that his diagnosis is consistent with bilateral plantar fasciitis.  I have showed him a series of stretches that he can perform.  I will also plan to order him physical therapy for a gastrocsoleus as well as Achilles stretching program. Plan :    -Return to clinic following MRI to discuss  results     I personally saw and evaluated the patient, and participated in the management and treatment plan.  Huel Cote, MD Attending Physician, Orthopedic Surgery  This document was dictated using Dragon voice recognition software. A reasonable attempt at proof reading has been made to minimize errors.

## 2021-12-30 ENCOUNTER — Ambulatory Visit: Payer: BC Managed Care – PPO | Admitting: Internal Medicine

## 2021-12-30 ENCOUNTER — Telehealth: Payer: Self-pay

## 2021-12-30 ENCOUNTER — Other Ambulatory Visit: Payer: BC Managed Care – PPO

## 2021-12-30 NOTE — Telephone Encounter (Signed)
Attempted to call patient to schedule lab appointment today with office. Not able to reach him at this time. Left voicemail requesting call back.  Juanita Laster, RMA

## 2021-12-31 ENCOUNTER — Other Ambulatory Visit: Payer: Self-pay

## 2021-12-31 ENCOUNTER — Other Ambulatory Visit: Payer: BC Managed Care – PPO

## 2021-12-31 DIAGNOSIS — B182 Chronic viral hepatitis C: Secondary | ICD-10-CM

## 2022-01-05 ENCOUNTER — Ambulatory Visit (HOSPITAL_BASED_OUTPATIENT_CLINIC_OR_DEPARTMENT_OTHER): Payer: BC Managed Care – PPO | Attending: Orthopaedic Surgery | Admitting: Physical Therapy

## 2022-01-05 ENCOUNTER — Encounter (HOSPITAL_BASED_OUTPATIENT_CLINIC_OR_DEPARTMENT_OTHER): Payer: Self-pay | Admitting: Physical Therapy

## 2022-01-05 DIAGNOSIS — M79672 Pain in left foot: Secondary | ICD-10-CM | POA: Insufficient documentation

## 2022-01-05 DIAGNOSIS — M79671 Pain in right foot: Secondary | ICD-10-CM | POA: Diagnosis present

## 2022-01-05 DIAGNOSIS — R262 Difficulty in walking, not elsewhere classified: Secondary | ICD-10-CM | POA: Diagnosis present

## 2022-01-05 DIAGNOSIS — M722 Plantar fascial fibromatosis: Secondary | ICD-10-CM | POA: Insufficient documentation

## 2022-01-05 NOTE — Therapy (Signed)
OUTPATIENT PHYSICAL THERAPY LOWER EXTREMITY EVALUATION   Patient Name: Chris Hamilton MRN: [id number] DOB:30-Jul-1975, 46 y.o., male Today's Date: 01/05/2022   PT End of Session - 01/05/22 1019     Visit Number 1    Number of Visits 7    Date for PT Re-Evaluation 02/18/22    Authorization Type BCBS    PT Start Time 1015    PT Stop Time 1055    PT Time Calculation (min) 40 min    Activity Tolerance Patient tolerated treatment well    Behavior During Therapy [facility] for tasks assessed/performed             Past Medical History:  Diagnosis Date   Hepatitis C, chronic (HCC)    Incarcerated umbilical hernia    S/P repair by Dr. Fredricka Bonine 09/22/2021   IV drug abuse (HCC)    Shingles 2022   Past Surgical History:  Procedure Laterality Date   CHOLECYSTECTOMY N/A 11/16/2021   Procedure: LAPAROSCOPIC CHOLECYSTECTOMY;  Surgeon: Darnell Level, MD;  Location: [facility];  Service: General;  Laterality: N/A;   UMBILICAL HERNIA REPAIR N/A 09/22/2021   Procedure: OPEN UMBILICAL HERNIA REPAIR WITH MESH;  Surgeon: Berna Bue, MD;  Location: [facility] [place];  Service: General;  Laterality: N/A;   Patient Active Problem List   Diagnosis Date Noted   Depression 12/23/2021   Status post umbilical hernia repair, follow-up exam 12/23/2021   Chronic hepatitis C without hepatic coma (HCC) 12/22/2021   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/15/2021   Obesity (BMI 30-39.9) 11/15/2021   Status post laparoscopic cholecystectomy 11/14/2021    PCP: Olive Bass, FNP  REFERRING PROVIDER: Huel Cote, MD  REFERRING DIAG: M72.2 (ICD-10-CM) - Plantar fasciitis  THERAPY DIAG:  Pain in right foot  Pain in left foot  Difficulty in walking, not elsewhere classified  Rationale for Evaluation and Treatment Rehabilitation  ONSET DATE: unsure  SUBJECTIVE:   SUBJECTIVE STATEMENT: My left knee started hurting first and I started limping- possible that started it. I  have gained a lot of weight this year. Both feet hurt. I tried some stretches on my own but they hurt. MRI on 20th for knee.   PERTINENT HISTORY: none  PAIN:  Are you having pain? Yes: NPRS scale: 1/10 Pain location: medial and lateral aspect of bil calcaneus Pain description: dull, bruise-like Aggravating factors: laying down for a while, AM pain, end of day increases pain Relieving factors: brazilian meds  PRECAUTIONS: None  WEIGHT BEARING RESTRICTIONS No  FALLS:  Has patient fallen in last 6 months? No   OCCUPATION: waits tables  PLOF: Independent  PATIENT GOALS decrease pain   OBJECTIVE:   DIAGNOSTIC FINDINGS: MRI on the 20th for Lt knee  PATIENT SURVEYS:  FOTO 49  COGNITION:  Overall cognitive status: Within functional limits for tasks assessed     SENSATION: Some numbness associated with pain in heels  EDEMA:  None noted  MUSCLE LENGTH: Bil gross limitation in post chain flexibility  POSTURE: Rt calcaneal inversion, no signs of arch collapse in weight bearing.   PALPATION: Denies TTP Good joint mobility bilaterally with more noted grossly in right than left    LOWER EXTREMITY MMT:  MMT Right eval Left eval  Hip flexion    Hip extension    Hip abduction    Hip adduction    Hip internal rotation    Hip external rotation    Knee flexion    Knee extension  Ankle dorsiflexion    Ankle plantarflexion    Ankle inversion    Ankle eversion     (Blank rows = not tested)   GAIT: Distance walked: in clinic Assistive device utilized: None Level of assistance: Complete Independence Comments: flat foot strike bilaterally, guarded through trunk     TODAY'S TREATMENT: Eval: Seated HSS Standing gastroc & soleus stretches Seated plantar fascia stretch Discussed incorporating these into the day to reduce overuse pain   PATIENT EDUCATION:  Education details: Anatomy of condition, POC, HEP, exercise form/rationale  Person educated:  Patient Education method: Explanation, Demonstration, Tactile cues, Verbal cues, and Handouts Education comprehension: verbalized understanding, returned demonstration, verbal cues required, tactile cues required, and needs further education   HOME EXERCISE PROGRAM: [id number]  ASSESSMENT:  CLINICAL IMPRESSION: Patient is a 46 y.o. M who was seen today for physical therapy evaluation and treatment for bilateral plantar fascia pain with associated left knee pain. Pt has lost 10 lb and is challenging himself to continue this.    OBJECTIVE IMPAIRMENTS Abnormal gait, decreased activity tolerance, difficulty walking, increased muscle spasms, impaired flexibility, improper body mechanics, and pain.   ACTIVITY LIMITATIONS standing and locomotion level  PARTICIPATION LIMITATIONS: meal prep, shopping, community activity, and occupation  PERSONAL FACTORS  recent weight gain  are also affecting patient's functional outcome.   REHAB POTENTIAL: Good  CLINICAL DECISION MAKING: Stable/uncomplicated  EVALUATION COMPLEXITY: Low   GOALS: Goals reviewed with patient? No  SHORT TERM GOALS: Target date: 01/19/2022  Pt will have found a way to incorporate exercises into work day Baseline: Goal status: INITIAL   LONG TERM GOALS: Target date: 02/18/22   Able to ambulate through his day with minimal pain Baseline: severe and limiting at eval Goal status: INITIAL  2.  FOTO to meet goal Baseline:  Goal status: INITIAL  3.  Resolution of TTP at calcaneal inserts of tendinous/ligamentous structures Baseline:  Goal status: INITIAL  4.  Pt will demo heel toe gait pattern when walking Baseline: flat foot strike at eval Goal status: INITIAL  5.  Independent in long term stretching program Baseline:  Goal status: INITIAL    PLAN: PT FREQUENCY: 1x/week  PT DURATION: 6 weeks  PLANNED INTERVENTIONS: Therapeutic exercises, Therapeutic activity, Neuromuscular re-education, Balance training,  Gait training, Patient/Family education, Joint mobilization, Stair training, Aquatic Therapy, Dry Needling, Electrical stimulation, Cryotherapy, Moist heat, Taping, Ultrasound, Ionotophoresis 4mg /ml Dexamethasone, Manual therapy, and Re-evaluation  PLAN FOR NEXT SESSION: review stretches, measure hip strength & add to HEP  Kortni Hasten C. Nygeria Lager PT, DPT 01/05/22 7:30 PM

## 2022-01-06 ENCOUNTER — Ambulatory Visit: Payer: BC Managed Care – PPO | Admitting: Internal Medicine

## 2022-01-06 ENCOUNTER — Telehealth: Payer: Self-pay

## 2022-01-06 ENCOUNTER — Other Ambulatory Visit (HOSPITAL_COMMUNITY): Payer: Self-pay

## 2022-01-06 NOTE — Telephone Encounter (Signed)
[facility] Patient Advocate Encounter  Insurance verification completed.    The patient is insured through BC[employer] [place].  Medication will need a PA.  We will continue to follow to see if copay assistance is needed.  Naomia Lenderman, CPhT Specialty Pharmacy Patient Advocate [facility] Phone: [phone] Fax:  [phone]  

## 2022-01-07 LAB — LIVER FIBROSIS, FIBROTEST-ACTITEST
ALT: 53 U/L — ABNORMAL HIGH (ref 9–46)
Alpha-2-Macroglobulin: 124 mg/dL (ref 106–279)
Apolipoprotein A1: 158 mg/dL (ref 94–176)
Bilirubin: 0.5 mg/dL (ref 0.2–1.2)
Fibrosis Score: 0.07
GGT: 42 U/L (ref 3–95)
Haptoglobin: 190 mg/dL (ref 43–212)
Necroinflammat ACT Score: 0.24
Reference ID: 4413035

## 2022-01-07 LAB — HEPATITIS C GENOTYPE: HCV Genotype: 3

## 2022-01-11 ENCOUNTER — Ambulatory Visit
Admission: RE | Admit: 2022-01-11 | Discharge: 2022-01-11 | Disposition: A | Discharge status: Home / Self Care | Payer: BC Managed Care – PPO | Source: Ambulatory Visit | Attending: Orthopaedic Surgery | Admitting: Orthopaedic Surgery

## 2022-01-11 DIAGNOSIS — M2392 Unspecified internal derangement of left knee: Secondary | ICD-10-CM

## 2022-01-11 IMAGING — MR MR KNEE*L* W/O CM
7 series · 40 of 40 positions shown · non-contrast
Comparison: Radiographs dated [DATE]

CLINICAL DATA: Chronic left knee pain.

EXAM:
MRI OF THE LEFT KNEE WITHOUT CONTRAST
TECHNIQUE: Multiplanar, multisequence MR imaging of the left was performed. No
intravenous contrast was administered.

[Series 6: T2 fat-sat · axial · left · 4.0mm · 0.50mm/px · z∈[-68,+85]mm · 6 of 36 slices shown (1 of 3)]
[im 1/36]
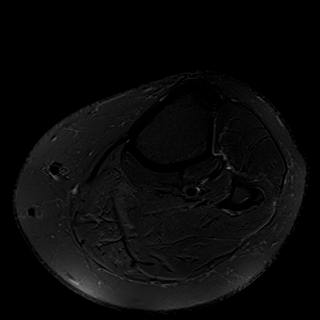
[im 8/36]
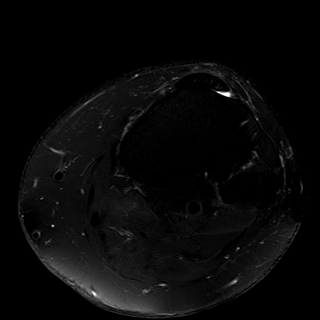
[im 15/36]
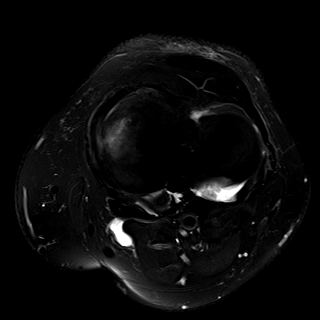
[im 22/36]
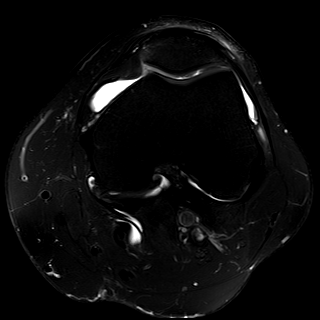
[im 29/36]
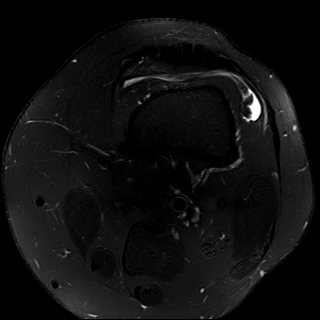
[im 36/36]
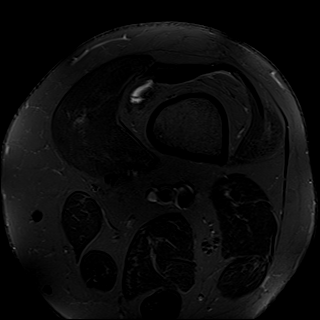

[Series 7: T2 fat-sat · coronal · left · 4.0mm · 0.47mm/px · 6 of 28 slices shown (2 of 3)]
[im 1/28]
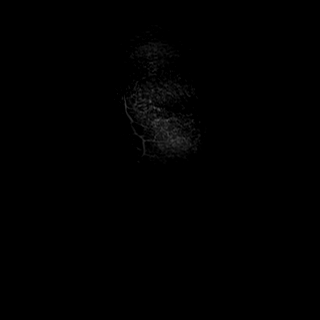
[im 6/28]
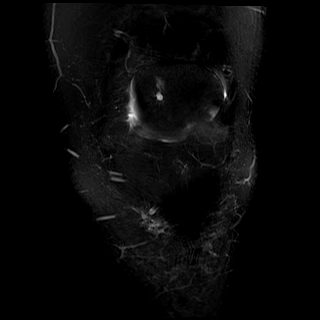
[im 11/28]
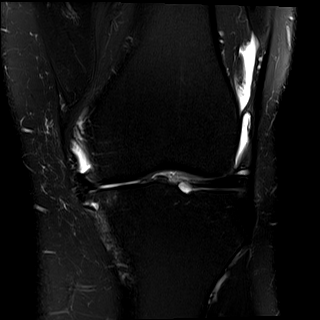
[im 17/28]
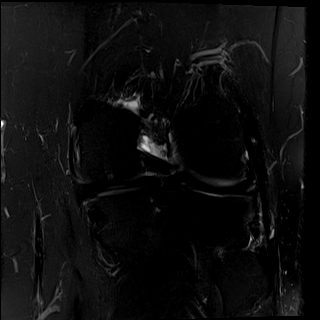
[im 22/28]
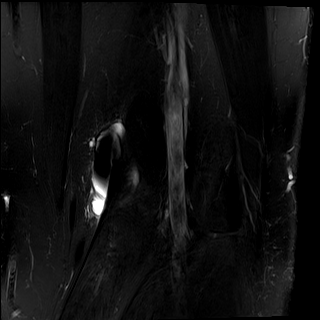
[im 28/28]
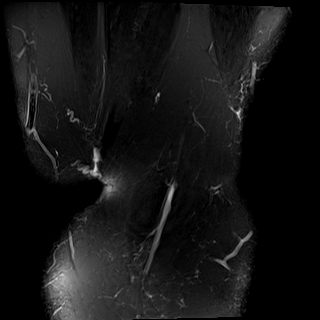

[Series 8: T1 · coronal · left · 4.0mm · 0.47mm/px · 6 of 28 slices shown]
[im 1/28]
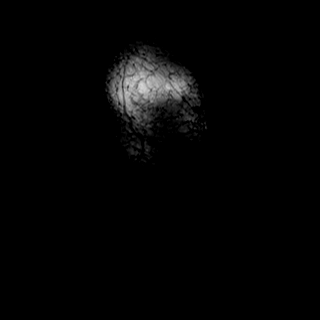
[im 6/28]
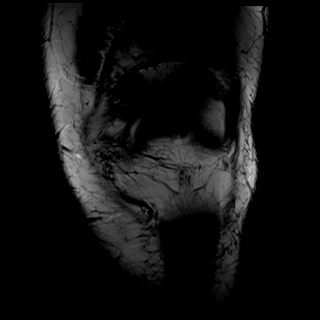
[im 11/28]
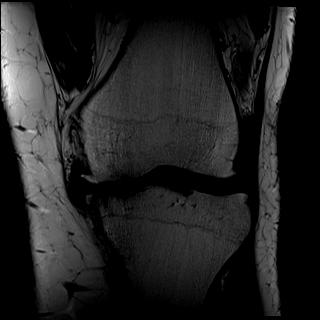
[im 17/28]
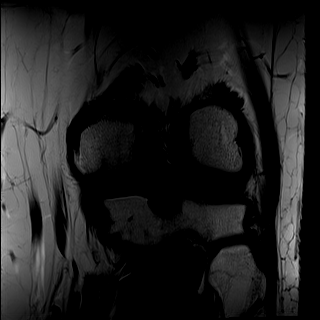
[im 22/28]
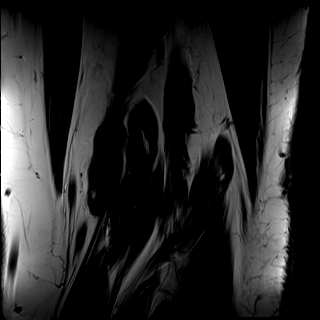
[im 28/28]
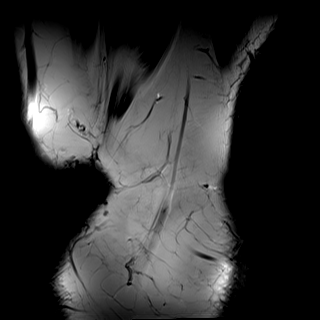

[Series 9: PD fat-sat · coronal · left · 3.0mm · 0.47mm/px · 6 of 32 slices shown (1 of 2)]
[im 1/32]
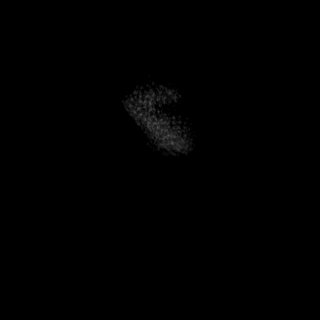
[im 7/32]
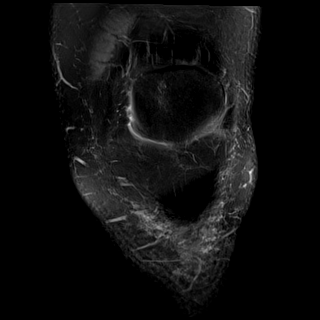
[im 13/32]
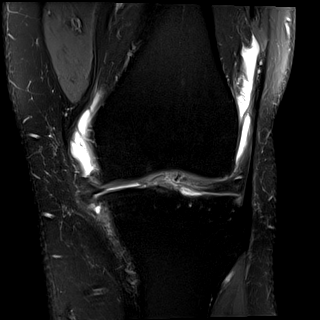
[im 19/32]
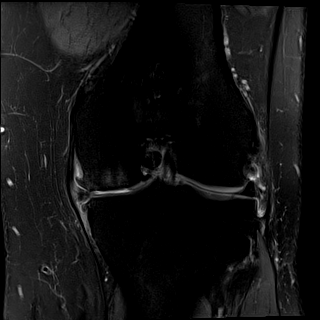
[im 25/32]
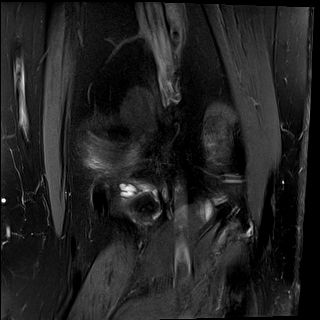
[im 32/32]
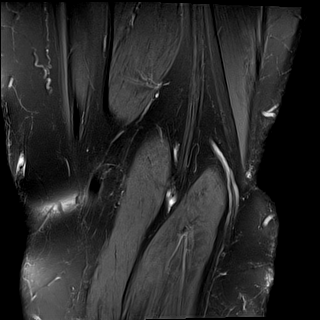

[Series 10: PD fat-sat · sagittal · left · 3.0mm · 0.39mm/px · 6 of 28 slices shown (2 of 2)]
[im 1/28]
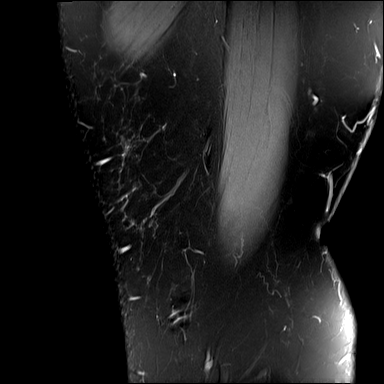
[im 6/28]
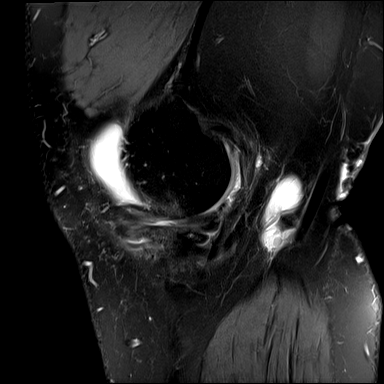
[im 11/28]
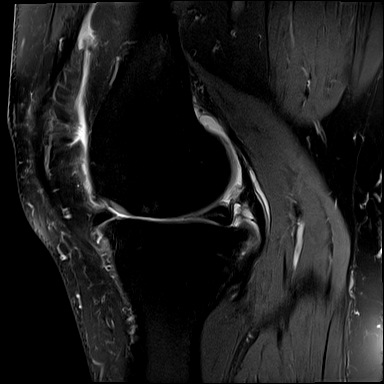
[im 17/28]
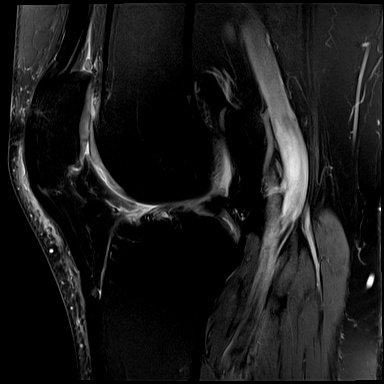
[im 22/28]
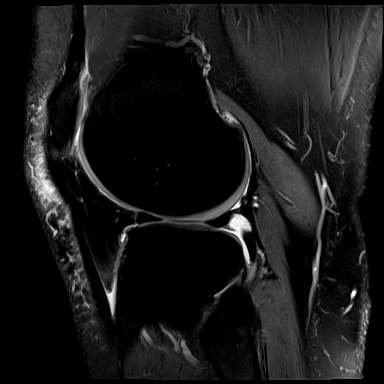
[im 28/28]
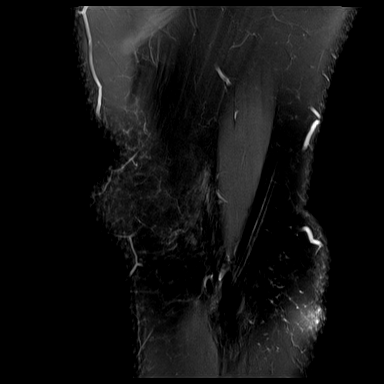

[Series 11: T2 fat-sat · sagittal · left · 3.0mm · 0.39mm/px · 6 of 28 slices shown (3 of 3)]
[im 1/28]
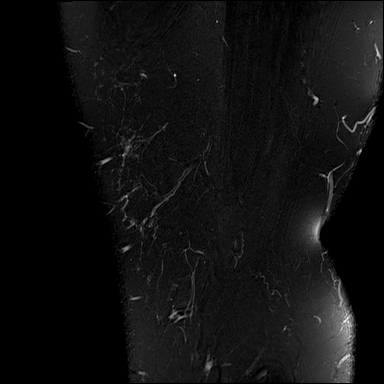
[im 6/28]
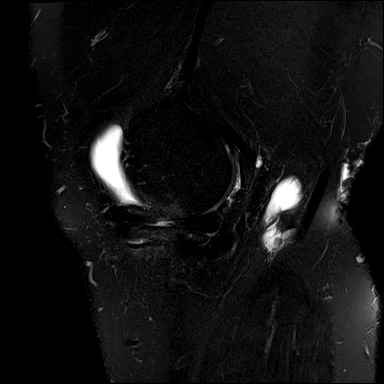
[im 11/28]
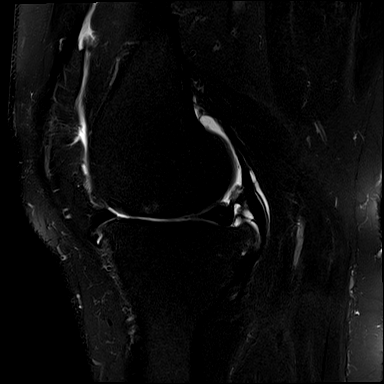
[im 17/28]
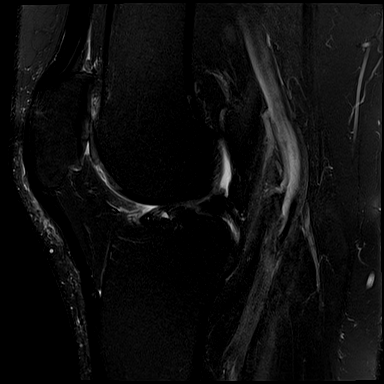
[im 22/28]
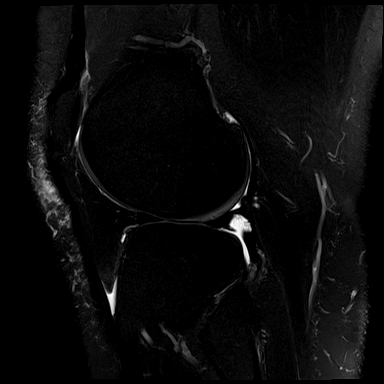
[im 28/28]
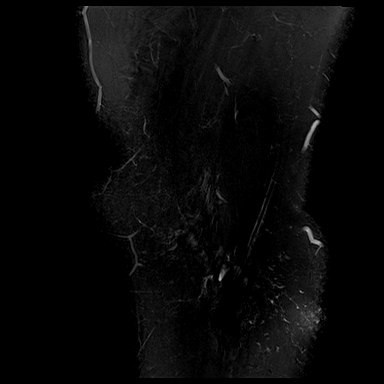

[Series 12: PD · coronal · left · 1.5mm · 0.44mm/px · 4 of 21 slices shown]
[im 1/21]
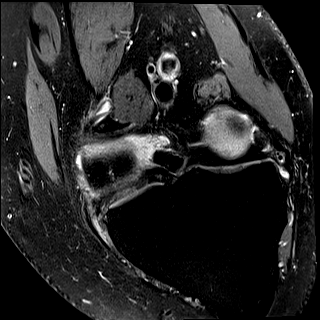
[im 7/21]
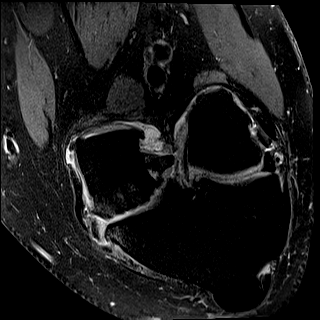
[im 14/21]
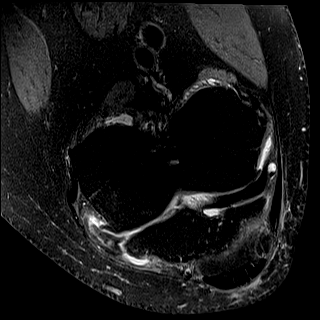
[im 21/21]
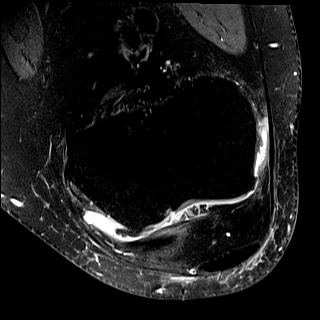

[40 of 40 positions shown; findings below may reference images not displayed]

FINDINGS: MENISCI

Medial: Complex tear of the body/posterior horn of the medial
meniscus with mild meniscal extrusion.

Lateral: Intact.

LIGAMENTS

Cruciates: ACL and PCL are intact.

Collaterals: Thickening of the femoral attachment medial collateral
ligament with mild edema concerning for chronic sprain. Lateral
collateral ligament complex is intact.

CARTILAGE

Patellofemoral: Focal full-thickness cartilage defect at the
patellar apex with subchondral cyst. Deep fissuring of the trochlear
articular cartilage with near complete loss of the articular
cartilage in the medial trochlea.

Medial: Full-thickness cartilage loss with subchondral edema of the
femoral condyle and medial tibial plateau.

Lateral:  No chondral defect.

JOINT: Moderate joint effusion. Normal HAITI. No plical
thickening.

POPLITEAL FOSSA: Popliteus tendon is intact. Small Baker's cyst
measuring approximately 1.6 x 1.6 x 3.1 cm.

EXTENSOR MECHANISM: Intact quadriceps tendon. Intact patellar
tendon. Intact lateral patellar retinaculum. Intact medial patellar
retinaculum. Intact MPFL.

BONES: No aggressive osseous lesion. No fracture or dislocation.
Moderate tricompartmental marginal osteophytes prominent in the
medial tibiofemoral compartment.

Other: No fluid collection or hematoma. Muscles are normal.
IMPRESSION: 1. Moderate medial tibiofemoral osteoarthritis with complex
degenerative tear of the medial meniscus, full-thickness cartilage
defect and subchondral edema of the medial femoral condyle and
medial tibial plateau with extruded meniscus.

2.  Mild patellofemoral osteoarthritis.

3.  Moderate joint effusion.

4.  Baker's cyst measuring approximately 1.6 x 1.6 x 3.1 cm.

5. Thickening of the medial collateral ligament with mild
surrounding edema concerning for ligamentous sprain, likely subacute
or chronic process.

## 2022-01-13 ENCOUNTER — Ambulatory Visit (INDEPENDENT_AMBULATORY_CARE_PROVIDER_SITE_OTHER): Payer: BC Managed Care – PPO | Admitting: Internal Medicine

## 2022-01-13 ENCOUNTER — Other Ambulatory Visit: Payer: Self-pay

## 2022-01-13 ENCOUNTER — Telehealth: Payer: Self-pay

## 2022-01-13 ENCOUNTER — Other Ambulatory Visit (HOSPITAL_COMMUNITY): Payer: Self-pay

## 2022-01-13 ENCOUNTER — Encounter: Payer: Self-pay | Admitting: Internal Medicine

## 2022-01-13 DIAGNOSIS — B182 Chronic viral hepatitis C: Secondary | ICD-10-CM | POA: Diagnosis not present

## 2022-01-13 MED ORDER — MAVYRET 100-40 MG PO TABS: 3.0000 | ORAL_TABLET | Freq: Every day | ORAL | 1 refills | Status: DC

## 2022-01-13 NOTE — Telephone Encounter (Signed)
[facility] Patient Advocate Encounter   Received notification from [facility] that prior authorization for Mavyret is required.   PA submitted on 01/13/22 Key [id number] Status is pending    [facility] Clinic will continue to follow.   Clearance Coots, CPhT Specialty Pharmacy Patient [facility] Phone: [phone] Fax:  [phone]

## 2022-01-13 NOTE — Progress Notes (Signed)
[facility]  Patient Active Problem List   Diagnosis Date Noted   Depression 12/23/2021    Priority: High   Chronic hepatitis C without hepatic coma (HCC) 12/22/2021    Priority: High   Status post umbilical hernia repair, follow-up exam 12/23/2021   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/15/2021   Obesity (BMI 30-39.9) 11/15/2021   Status post laparoscopic cholecystectomy 11/14/2021    Patient's Medications  New Prescriptions   GLECAPREVIR-PIBRENTASVIR (MAVYRET) 100-40 MG TABS    Take 3 tablets by mouth daily.  Previous Medications   ACETAMINOPHEN (TYLENOL) 500 MG TABLET    Take 2 tablets (1,000 mg total) by mouth every 6 (six) hours as needed for mild pain or fever.   ALBUTEROL (VENTOLIN HFA) 108 (90 BASE) MCG/ACT INHALER    Inhale 2 puffs into the lungs every 6 (six) hours as needed for wheezing or shortness of breath.   IBUPROFEN (ADVIL) 800 MG TABLET    Take 1 tablet (800 mg total) by mouth every 8 (eight) hours as needed.   UNABLE TO FIND    Take 2 Scoops by mouth daily. Med Name: Kratom   UNABLE TO FIND    Take by mouth daily. Med Name: Tandrilax  Modified Medications   No medications on file  Discontinued Medications   No medications on file    Subjective: Chris Hamilton is in for his routine hepatitis C follow-up visit.  He is feeling well.  He tells me that he is having a contest with his mother to see who can lose the most weight by August 1.  He tells me that if he loses 20 pounds or more she is going to give him $2000.  Review of Systems: Review of Systems  Constitutional:  Negative for fever.  Gastrointestinal:  Negative for abdominal pain, diarrhea, nausea and vomiting.    Past Medical History:  Diagnosis Date   Hepatitis C, chronic (HCC)    Incarcerated umbilical hernia    S/P repair by Dr. Fredricka Bonine 09/22/2021   IV drug abuse [facility])    Shingles 2022    Social History   Tobacco Use   Smoking status: Every Day    Types:  E-cigarettes   Smokeless tobacco: Never  Vaping Use   Vaping Use: Every day   Substances: Nicotine  Substance Use Topics   Alcohol use: Not Currently   Drug use: Not Currently    Types: Methamphetamines, Heroin, Cocaine, MDMA (Ecstacy), LSD, Marijuana, Amphetamines, Oxycodone, Benzodiazepines    Comment: past meth and heroin use    Family History  Problem Relation Age of Onset   Cancer Mother    Miscarriages / Stillbirths Mother    Cancer Father    Diabetes Maternal Grandmother    Cancer Maternal Grandmother    Heart disease Neg Hx     No Known Allergies  Objective: Vitals:   01/13/22 1057  BP: 101/74  Pulse: 68  Temp: 98.2 F (36.8 C)  TempSrc: Oral  Weight: 271 lb (122.9 kg)   Body mass index is 34.79 kg/m.  Physical Exam Constitutional:      Comments: He is in good spirits.  Cardiovascular:     Rate and Rhythm: Normal rate.  Pulmonary:     Effort: Pulmonary effort is normal.  Psychiatric:        Mood and Affect: Mood normal.     Lab Results Hepatitis C viral load 11/16/2021 10,500 Hepatitis C genotype 3  Fibrosis score F0   Problem List Items Addressed This Visit       High   Chronic hepatitis C without hepatic coma (HCC)    We have submitted prior authorization request for Mavyret to be taken for 8 weeks.  He will follow-up here on 02/22/2022.      Relevant Medications   Glecaprevir-Pibrentasvir (MAVYRET) 100-40 MG TABS     Chris Asters, MD [facility] [phone] pager   [phone] cell 01/13/2022, 11:32 AM

## 2022-01-17 ENCOUNTER — Other Ambulatory Visit: Payer: Self-pay | Admitting: Pharmacist

## 2022-01-17 ENCOUNTER — Telehealth: Payer: Self-pay

## 2022-01-17 ENCOUNTER — Other Ambulatory Visit (HOSPITAL_COMMUNITY): Payer: Self-pay

## 2022-01-17 DIAGNOSIS — B182 Chronic viral hepatitis C: Secondary | ICD-10-CM

## 2022-01-17 MED ORDER — MAVYRET 100-40 MG PO TABS
3.0000 | ORAL_TABLET | Freq: Every day | ORAL | 1 refills | Status: DC
  Filled 2022-01-17: qty 84, fill #0
  Filled 2022-01-18: qty 84, 28d supply, fill #0
  Filled 2022-02-07: qty 84, 28d supply, fill #1

## 2022-01-18 ENCOUNTER — Telehealth: Payer: Self-pay | Admitting: Pharmacist

## 2022-01-18 ENCOUNTER — Other Ambulatory Visit (HOSPITAL_COMMUNITY): Payer: Self-pay

## 2022-01-18 NOTE — Telephone Encounter (Signed)
Patient is approved to receive Mavyret x 8 weeks for chronic Hepatitis C infection. Counseled patient to take all three tablets of Mavyret daily with food.  Counseled patient the need to take all three tablets together and to not separate them out during the day. Encouraged patient not to miss any doses and explained how their chance of cure could go down with each dose missed. Counseled patient on what to do if dose is missed - if it is closer to the missed dose take immediately; if closer to next dose then skip dose and take the next dose at the usual time. Counseled patient on common side effects such as headache, fatigue, and nausea and that these normally decrease with time.   I reviewed patient medications and found one potential drug interaction with kratom. He states he takes this everyday, and there is a potential for it to increase Mavyret concentrations. Counseled Kendrix to call us if he experiences any pronounced side effects while on therapy as he may have to hold his kratom. Discussed with patient that there are several drug interactions with Mavyret and instructed patient to call the clinic if he wishes to start a new medication during course of therapy. Also advised patient to call if he experiences any side effects. Patient will follow-up with Dr. Orvan Falconer on 8/1.  Margarite Gouge, PharmD, CPP Clinical Pharmacist Practitioner Infectious Diseases Clinical Pharmacist [facility]

## 2022-01-19 ENCOUNTER — Encounter (HOSPITAL_BASED_OUTPATIENT_CLINIC_OR_DEPARTMENT_OTHER): Payer: Self-pay | Admitting: Physical Therapy

## 2022-01-19 ENCOUNTER — Ambulatory Visit (HOSPITAL_BASED_OUTPATIENT_CLINIC_OR_DEPARTMENT_OTHER): Payer: BC Managed Care – PPO | Admitting: Physical Therapy

## 2022-01-19 DIAGNOSIS — M79671 Pain in right foot: Secondary | ICD-10-CM | POA: Diagnosis not present

## 2022-01-19 DIAGNOSIS — R262 Difficulty in walking, not elsewhere classified: Secondary | ICD-10-CM

## 2022-01-19 DIAGNOSIS — M79672 Pain in left foot: Secondary | ICD-10-CM

## 2022-01-19 NOTE — Therapy (Addendum)
OUTPATIENT PHYSICAL THERAPY LOWER EXTREMITY TREATMENT NOTE DISCHARGE  Patient Name: Chris Hamilton MRN: [id number] DOB:03/19/1976, 46 y.o., male Today's Date: 01/19/2022   PT End of Session - 01/19/22 1418     Visit Number 2    Number of Visits 7    Date for PT Re-Evaluation 02/18/22    Authorization Type BCBS    PT Start Time 1418    PT Stop Time 1457    PT Time Calculation (min) 39 min    Activity Tolerance Patient tolerated treatment well    Behavior During Therapy [facility] for tasks assessed/performed             Past Medical History:  Diagnosis Date   Hepatitis C, chronic ([place])    Incarcerated umbilical hernia    S/P repair by Dr. Kae Heller 09/22/2021   IV drug abuse ([place])    Shingles 2022   Past Surgical History:  Procedure Laterality Date   CHOLECYSTECTOMY N/A 11/16/2021   Procedure: LAPAROSCOPIC CHOLECYSTECTOMY;  Surgeon: Armandina Gemma, MD;  Location: [facility];  Service: General;  Laterality: N/A;   UMBILICAL HERNIA REPAIR N/A 09/22/2021   Procedure: OPEN UMBILICAL HERNIA REPAIR WITH MESH;  Surgeon: Clovis Riley, MD;  Location: [place];  Service: General;  Laterality: N/A;   Patient Active Problem List   Diagnosis Date Noted   Depression 12/23/2021   Status post umbilical hernia repair, follow-up exam 12/23/2021   Chronic hepatitis C without hepatic coma ([place]) 12/22/2021   History of drug abuse in remission ([place]) 11/15/2021   Obesity (BMI 30-39.9) 11/15/2021   Status post laparoscopic cholecystectomy 11/14/2021    PCP: Marrian Salvage, FNP  REFERRING PROVIDER: Vanetta Mulders, MD  REFERRING DIAG: M72.2 (ICD-10-CM) - Plantar fasciitis  THERAPY DIAG:  Pain in right foot  Pain in left foot  Difficulty in walking, not elsewhere classified  Rationale for Evaluation and Treatment Rehabilitation  ONSET DATE: unsure  SUBJECTIVE:   SUBJECTIVE STATEMENT: "Today is not a good day".   He gets results of MRI on from Dr on Friday. Pt  reports he has some questions of if he is completing his stretches correctly.   PERTINENT HISTORY: none  PAIN:  Are you having pain? Yes: NPRS scale: 5/10 Pain location: medial and lateral aspect of bil calcaneus Pain description: dull, bruise-like Aggravating factors: laying down for a while, AM pain, end of day increases pain Relieving factors: brazilian meds  PRECAUTIONS: None  WEIGHT BEARING RESTRICTIONS No  FALLS:  Has patient fallen in last 6 months? No   OCCUPATION: waits tables  PLOF: Independent  PATIENT GOALS decrease pain   OBJECTIVE:   DIAGNOSTIC FINDINGS: MRI on the 20th for Lt knee  PATIENT SURVEYS:  FOTO 49  COGNITION:  Overall cognitive status: Within functional limits for tasks assessed     SENSATION: Some numbness associated with pain in heels  EDEMA:  None noted  MUSCLE LENGTH: Bil gross limitation in post chain flexibility  POSTURE: Rt calcaneal inversion, no signs of arch collapse in weight bearing.   PALPATION: Denies TTP Good joint mobility bilaterally with more noted grossly in right than left    LOWER EXTREMITY MMT:  MMT Right 01/19/22 Left 01/19/22  Hip flexion 39.4 46.2  Hip extension    Hip abduction 60.1 49.3  Hip adduction    Hip internal rotation    Hip external rotation    Knee flexion 39.0 35.4  Knee extension 41.3 34.0  Ankle dorsiflexion    Ankle plantarflexion  Ankle inversion    Ankle eversion     (Blank rows = not tested)   GAIT: Distance walked: in clinic Assistive device utilized: None Level of assistance: Complete Independence Comments: flat foot strike bilaterally, guarded through trunk     TODAY'S TREATMENT: NuStep L4 (legs only) x 5 min for warm up Gastroc stretch with foot on shelf of open cabinet x 2 each LE Soleus stretch with foot on shelf of open cabinet x 2 each LE; repeated in form of HEP Seated hamstring stretch x 2 reps, each LE Supine hamstring stretch with strap (hooklying) x  1 rep each leg  MMT of LE  Reviewed self massage to plantar fascia Regular Rock tape applied to bilat feet:  2 I strips from calcaneus to metatarsal heads; perpendicular strips applied near calcaneus - all with 25-50% stretch to provide support to plantar fascia    PATIENT EDUCATION:  Education details: Anatomy of condition, POC, HEP, exercise form/rationale  Person educated: Patient Education method: Explanation, Demonstration, Tactile cues, Verbal cues, and Handouts Education comprehension: verbalized understanding, returned demonstration, verbal cues required, tactile cues required, and needs further education   HOME EXERCISE PROGRAM: [id number]  ASSESSMENT:  CLINICAL IMPRESSION: Pt required minor cues for form of exercises of HEP.  He has difficulty straightening Lt knee with gastroc stretch and hamstring stretch due to increase in knee pain with end range extension. He tolerated seated hamstring stretch better than hooklying.  LLE weaker than RLE with MMT.  Encouraged pt to not walk barefoot in house, and to wear some sort of support shoes.  Goals are ongoing.    OBJECTIVE IMPAIRMENTS Abnormal gait, decreased activity tolerance, difficulty walking, increased muscle spasms, impaired flexibility, improper body mechanics, and pain.   ACTIVITY LIMITATIONS standing and locomotion level  PARTICIPATION LIMITATIONS: meal prep, shopping, community activity, and occupation  PERSONAL FACTORS  recent weight gain  are also affecting patient's functional outcome.   REHAB POTENTIAL: Good  CLINICAL DECISION MAKING: Stable/uncomplicated  EVALUATION COMPLEXITY: Low   GOALS: Goals reviewed with patient? No  SHORT TERM GOALS: Target date: 01/19/2022  Pt will have found a way to incorporate exercises into work day Baseline: Goal status: INITIAL   LONG TERM GOALS: Target date: 02/18/22   Able to ambulate through his day with minimal pain Baseline: severe and limiting at eval Goal  status: INITIAL  2.  FOTO to meet goal Baseline:  Goal status: INITIAL  3.  Resolution of TTP at calcaneal inserts of tendinous/ligamentous structures Baseline:  Goal status: INITIAL  4.  Pt will demo heel toe gait pattern when walking Baseline: flat foot strike at eval Goal status: INITIAL  5.  Independent in long term stretching program Baseline:  Goal status: INITIAL    PLAN: PT FREQUENCY: 1x/week  PT DURATION: 6 weeks  PLANNED INTERVENTIONS: Therapeutic exercises, Therapeutic activity, Neuromuscular re-education, Balance training, Gait training, Patient/Family education, Joint mobilization, Stair training, Aquatic Therapy, Dry Needling, Electrical stimulation, Cryotherapy, Moist heat, Taping, Ultrasound, Ionotophoresis 103m/ml Dexamethasone, Manual therapy, and Re-evaluation  PLAN FOR NEXT SESSION: progress LE strengthening and flexibility ;add to [employer] PTA 01/19/22 4:45 PM  PHYSICAL THERAPY DISCHARGE SUMMARY  Visits from Start of Care: 2  Current functional level related to goals / functional outcomes: See above   Remaining deficits: See above   Education / Equipment: Anatomy of condition, POC, HEP, exercise form/rationale    Patient agrees to discharge. Patient goals were not met. Patient is being discharged due to  MRI  revealing need for surgery but does not want another surgery this year, states he will do his exercises at home per phone call on 01/26/22.  Jessica C. Hightower PT, DPT 01/26/22 1:18 PM

## 2022-01-21 ENCOUNTER — Ambulatory Visit (HOSPITAL_BASED_OUTPATIENT_CLINIC_OR_DEPARTMENT_OTHER): Payer: BC Managed Care – PPO | Admitting: Orthopaedic Surgery

## 2022-01-21 VITALS — Ht 74.0 in | Wt 267.0 lb

## 2022-01-21 DIAGNOSIS — M1712 Unilateral primary osteoarthritis, left knee: Secondary | ICD-10-CM

## 2022-01-21 NOTE — Progress Notes (Signed)
Chief Complaint: Left knee pain     History of Present Illness:   01/21/2022: Presents today for MRI follow-up of the left knee.  Overall he states he has been doing quite well recently with minimal pain.  Chris Hamilton is a 46 y.o. male presents today with ongoing left knee pain.  He states that when he was 16 he was diagnosed with a meniscus tear and subsequently surgery was deferred and he did come with respiratory illness.  After that time he began experiencing some pain relief and as result the knee was not further addressed.  He does state that the knee is continue to feel unstable and painful.  He will occasionally feel pops in the knee.  He does have mechanical symptoms involving the knee.  He has not had any physical therapy.  He states that the knee continues to give out on him.  He works waiting tables locally here in [place] and does not feel stable with regard to this knee.  He is here today for further assessment.  He is also complaining of bilateral heel pain.  This is occurring on the medial aspect of the arch.  This has been going on for several months now.  He has used a frozen water bottle about of the heel to help.    Surgical History:   None  PMH/PSH/Family History/Social History/Meds/Allergies:    Past Medical History:  Diagnosis Date   Hepatitis C, chronic (H[facility])    Incarcerated umbilical hernia    S/P repair by Dr. Fredricka Bonine 09/22/2021   IV drug abuse (H[facility])    Shingles 2022   Past Surgical History:  Procedure Laterality Date   CHOLECYSTECTOMY N/A 11/16/2021   Procedure: LAPAROSCOPIC CHOLECYSTECTOMY;  Surgeon: Darnell Level, MD;  Location[facility];  Service: General;  Laterality: N/A;   UMBILICAL HERNIA REPAIR N/A 09/22/2021   Procedure: OPEN UMBILICAL HERNIA REPAIR WITH MESH;  Surgeon: Berna Bue, MD;  Location[facility];  Service: General;  Laterality: N/A;   Social History   Socioeconomic History    Marital status: Single    Spouse name: Not on file   Number of children: Not on file   Years of education: Not on file   Highest education level: Not on file  Occupational History   Not on file  Tobacco Use   Smoking status: Every Day    Types: E-cigarettes   Smokeless tobacco: Never  Vaping Use   Vaping Use: Every day   Substances: Nicotine  Substance and Sexual Activity   Alcohol use: Not Currently   Drug use: Not Currently    Types: Methamphetamines, Heroin, Cocaine, MDMA (Ecstacy), LSD, Marijuana, Amphetamines, Oxycodone, Benzodiazepines    Comment: past meth and heroin use   Sexual activity: Yes  Other Topics Concern   Not on file  Social History Narrative   Not on file   Social Determinants of Health   Financial Resource Strain: Not on file  Food Insecurity: Not on file  Transportation Needs: Not on file  Physical Activity: Not on file  Stress: Not on file  Social Connections: Not on file   Family History  Problem Relation Age of Onset   Cancer Mother    Miscarriages / [place] Mother    Cancer Father    Diabetes Maternal Grandmother  Cancer Maternal Grandmother    Heart disease Neg Hx    No Known Allergies Current Outpatient Medications  Medication Sig Dispense Refill   acetaminophen (TYLENOL) 500 MG tablet Take 2 tablets (1,000 mg total) by mouth every 6 (six) hours as needed for mild pain or fever. (Patient not taking: Reported on 12/23/2021)     albuterol (VENTOLIN HFA) 108 (90 Base) MCG/ACT inhaler Inhale 2 puffs into the lungs every 6 (six) hours as needed for wheezing or shortness of breath. (Patient not taking: Reported on 12/23/2021) 8 g 2   Glecaprevir-Pibrentasvir (MAVYRET) 100-40 MG TABS Take 3 tablets by mouth daily with breakfast. 84 tablet 1   ibuprofen (ADVIL) 800 MG tablet Take 1 tablet (800 mg total) by mouth every 8 (eight) hours as needed. 30 tablet 0   UNABLE TO FIND Take 2 Scoops by mouth daily. Med Name: Kratom     UNABLE TO FIND  Take by mouth daily. Med Name: Juventino Slovak (Patient not taking: Reported on 12/23/2021)     No current facility-administered medications for this visit.   No results found.  Review of Systems:   A ROS was performed including pertinent positives and negatives as documented in the HPI.  Physical Exam :   Constitutional: NAD and appears stated age Neurological: Alert and oriented Psych: Appropriate affect and cooperative Height 6\' 2"  (1.88 m), weight 267 lb (121.1 kg).   Comprehensive Musculoskeletal Exam:      Musculoskeletal Exam  Gait Normal  Alignment Normal   Right Left  Inspection Normal Normal  Palpation    Tenderness None Medial joint  Crepitus None None  Effusion None None  Range of Motion    Extension 0 0  Flexion 135 135  Strength    Extension 5/5 5/5  Flexion 5/5 5/5  Ligament Exam     Generalized Laxity No No  Lachman Negative Negative   Pivot Shift Negative Negative  Anterior Drawer Negative Negative  Valgus at 0 Negative Negative  Valgus at 20 Negative Negative  Varus at 0 0 0  Varus at 20   0 0  Posterior Drawer at 90 0 0  Vascular/Lymphatic Exam    Edema None None  Venous Stasis Changes No No  Distal Circulation Normal Normal  Neurologic    Light Touch Sensation Intact Intact  Special Tests:      Imaging:   Xray (4 views left knee): Normal  MRI left knee: He has significant medial meniscal tearing and extrusion with 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   Assessment:   46 y.o. male with a longstanding history of a medial meniscal injury and deficiency.  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  I did discuss treatment options with him today.  I did describe that steroid injections could be a bridge prior to any type of definitive procedure.  That being said given his progressive arthritis I do not believe that he would get  significant long-term relief from these.  I did discuss the role for possible unicompartmental knee arthroplasty with him given the fact that his arthritis is quite isolated.  Overall he feels quite compensated at today's visit and does not wish to pursue this.  I will plan to see him back as needed should he wish to pursue a steroid injection Plan :    -Return to clinic as needed     I personally saw and evaluated the  patient, and participated in the management and treatment plan.  Vanetta Mulders, MD Attending Physician, Orthopedic Surgery  This document was dictated using Dragon voice recognition software. A reasonable attempt at proof reading has been made to minimize errors.

## 2022-01-26 ENCOUNTER — Ambulatory Visit (HOSPITAL_BASED_OUTPATIENT_CLINIC_OR_DEPARTMENT_OTHER): Payer: BC Managed Care – PPO | Admitting: Physical Therapy

## 2022-01-26 ENCOUNTER — Encounter (HOSPITAL_BASED_OUTPATIENT_CLINIC_OR_DEPARTMENT_OTHER): Payer: Self-pay

## 2022-02-02 ENCOUNTER — Encounter (HOSPITAL_BASED_OUTPATIENT_CLINIC_OR_DEPARTMENT_OTHER): Payer: Self-pay | Admitting: Physical Therapy

## 2022-02-07 ENCOUNTER — Other Ambulatory Visit (HOSPITAL_COMMUNITY): Payer: Self-pay

## 2022-02-09 ENCOUNTER — Encounter (HOSPITAL_BASED_OUTPATIENT_CLINIC_OR_DEPARTMENT_OTHER): Payer: Self-pay | Admitting: Physical Therapy

## 2022-02-14 ENCOUNTER — Other Ambulatory Visit (HOSPITAL_COMMUNITY): Payer: Self-pay

## 2022-02-22 ENCOUNTER — Ambulatory Visit (INDEPENDENT_AMBULATORY_CARE_PROVIDER_SITE_OTHER): Payer: BC Managed Care – PPO | Admitting: Internal Medicine

## 2022-02-22 ENCOUNTER — Encounter: Payer: Self-pay | Admitting: Internal Medicine

## 2022-02-22 ENCOUNTER — Other Ambulatory Visit: Payer: Self-pay

## 2022-02-22 DIAGNOSIS — B182 Chronic viral hepatitis C: Secondary | ICD-10-CM | POA: Diagnosis not present

## 2022-02-22 DIAGNOSIS — E669 Obesity, unspecified: Secondary | ICD-10-CM

## 2022-02-22 DIAGNOSIS — F329 Major depressive disorder, single episode, unspecified: Secondary | ICD-10-CM | POA: Diagnosis not present

## 2022-02-22 NOTE — Assessment & Plan Note (Signed)
He has already been in his goal of a 20 pound weight loss.

## 2022-02-22 NOTE — Assessment & Plan Note (Signed)
He is halfway through his antiviral therapy for chronic hepatitis C.  He will get repeat liver enzymes and a hepatitis C viral load today.  He will continue Mavyret and follow-up in 4 weeks.

## 2022-02-22 NOTE — Progress Notes (Signed)
[facility]  Patient Active Problem List   Diagnosis Date Noted   Depression 12/23/2021    Priority: High   Chronic hepatitis C without hepatic coma ([facility]) 12/22/2021    Priority: High   Status post umbilical hernia repair, follow-up exam 12/23/2021   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/15/2021   Obesity (BMI 30-39.9) 11/15/2021   Status post laparoscopic cholecystectomy 11/14/2021    Patient's Medications  New Prescriptions   No medications on file  Previous Medications   ACETAMINOPHEN (TYLENOL) 500 MG TABLET    Take 2 tablets (1,000 mg total) by mouth every 6 (six) hours as needed for mild pain or fever.   ALBUTEROL (VENTOLIN HFA) 108 (90 BASE) MCG/ACT INHALER    Inhale 2 puffs into the lungs every 6 (six) hours as needed for wheezing or shortness of breath.   GLECAPREVIR-PIBRENTASVIR (MAVYRET) 100-40 MG TABS    Take 3 tablets by mouth daily with breakfast.   IBUPROFEN (ADVIL) 800 MG TABLET    Take 1 tablet (800 mg total) by mouth every 8 (eight) hours as needed.   UNABLE TO FIND    Take 2 Scoops by mouth daily. Med Name: Kratom   UNABLE TO FIND    Take by mouth daily. Med Name: Tandrilax  Modified Medications   No medications on file  Discontinued Medications   No medications on file    Subjective: Chris Hamilton is in for his routine hepatitis C follow-up visit.  He started on Mavyret on 01/18/2020.  His hepatitis C viral load prior to starting was 10,500.  He has not had any side effects from his neighbor at and he has not missed any doses.  He has made some fairly drastic changes in his diet.  He does not eat after 5 PM and he has reduced his calories.  He has lost 25 pounds over the past few months.  He says he is not feeling depressed.  Review of Systems: Review of Systems  Constitutional:  Positive for weight loss. Negative for fever.  Gastrointestinal:  Negative for abdominal pain, nausea and vomiting.  Psychiatric/Behavioral:   Negative for depression.     Past Medical History:  Diagnosis Date   Hepatitis C, chronic (HCC)    Incarcerated umbilical hernia    S/P repair by Dr. Fredricka Bonine 09/22/2021   IV drug abuse [facility])    Shingles 2022    Social History   Tobacco Use   Smoking status: Every Day    Types: E-cigarettes   Smokeless tobacco: Never  Vaping Use   Vaping Use: Every day   Substances: Nicotine  Substance Use Topics   Alcohol use: Not Currently   Drug use: Not Currently    Types: Methamphetamines, Heroin, Cocaine, MDMA (Ecstacy), LSD, Marijuana, Amphetamines, Oxycodone, Benzodiazepines    Comment: past meth and heroin use    Family History  Problem Relation Age of Onset   Cancer Mother    Miscarriages / Stillbirths Mother    Cancer Father    Diabetes Maternal Grandmother    Cancer Maternal Grandmother    Heart disease Neg Hx     No Known Allergies  Objective: Vitals:   02/22/22 1408  BP: 123/83  Pulse: 76  Resp: 16  SpO2: 96%  Weight: 257 lb 6.4 oz (116.8 kg)  Height: 6\' 2"  (1.88 m)   Body mass index is 33.05 kg/m.  Physical Exam Constitutional:      Comments: He  is calm and in good spirits.  Cardiovascular:     Rate and Rhythm: Normal rate.  Pulmonary:     Effort: Pulmonary effort is normal.  Psychiatric:        Mood and Affect: Mood normal.     Lab Results CMP     Component Value Date/Time   NA 140 11/16/2021 0431   K 3.6 11/16/2021 0431   CL 109 11/16/2021 0431   CO2 25 11/16/2021 0431   GLUCOSE 98 11/16/2021 0431   BUN 8 11/16/2021 0431   CREATININE 0.63 11/16/2021 0431   CALCIUM 8.5 (L) 11/16/2021 0431   PROT 6.5 11/16/2021 0431   ALBUMIN 3.4 (L) 11/16/2021 0431   AST 202 (H) 11/16/2021 0431   ALT 53 (H) 12/31/2021 1105   ALKPHOS 305 (H) 11/16/2021 0431   BILITOT 1.0 11/16/2021 0431   GFRNONAA >60 11/16/2021 0431    Hepatitis C viral load 11/14/2021 10,500    Problem List Items Addressed This Visit       High   Chronic hepatitis C without  hepatic coma (HCC)    He is halfway through his antiviral therapy for chronic hepatitis C.  He will get repeat liver enzymes and a hepatitis C viral load today.  He will continue Mavyret and follow-up in 4 weeks.      Relevant Orders   Hepatitis C RNA quantitative   Comprehensive metabolic panel   Depression    His depression has improved.        Unprioritized   Obesity (BMI 30-39.9)    He has already been in his goal of a 20 pound weight loss.        Cliffton Asters, MD [facility] for Infectious Disease [facility] [phone] pager   [phone] cell 02/22/2022, 2:21 PM

## 2022-02-22 NOTE — Assessment & Plan Note (Signed)
His depression has improved.

## 2022-02-24 LAB — COMPREHENSIVE METABOLIC PANEL
AG Ratio: 1.8 (calc) (ref 1.0–2.5)
ALT: 33 U/L (ref 9–46)
AST: 20 U/L (ref 10–40)
Albumin: 4.4 g/dL (ref 3.6–5.1)
Alkaline phosphatase (APISO): 99 U/L (ref 36–130)
BUN: 18 mg/dL (ref 7–25)
CO2: 26 mmol/L (ref 20–32)
Calcium: 8.8 mg/dL (ref 8.6–10.3)
Chloride: 103 mmol/L (ref 98–110)
Creat: 0.72 mg/dL (ref 0.60–1.29)
Globulin: 2.5 g/dL (calc) (ref 1.9–3.7)
Glucose, Bld: 102 mg/dL — ABNORMAL HIGH (ref 65–99)
Potassium: 4.4 mmol/L (ref 3.5–5.3)
Sodium: 138 mmol/L (ref 135–146)
Total Bilirubin: 0.4 mg/dL (ref 0.2–1.2)
Total Protein: 6.9 g/dL (ref 6.1–8.1)

## 2022-02-24 LAB — HEPATITIS C RNA QUANTITATIVE
HCV Quantitative Log: <1.18 log IU/mL
HCV RNA, PCR, QN: <15 IU/mL

## 2022-03-10 ENCOUNTER — Other Ambulatory Visit (HOSPITAL_COMMUNITY): Payer: Self-pay

## 2022-03-30 ENCOUNTER — Other Ambulatory Visit: Payer: Self-pay

## 2022-03-30 ENCOUNTER — Ambulatory Visit (INDEPENDENT_AMBULATORY_CARE_PROVIDER_SITE_OTHER): Payer: BC Managed Care – PPO | Admitting: Internal Medicine

## 2022-03-30 ENCOUNTER — Encounter: Payer: Self-pay | Admitting: Internal Medicine

## 2022-03-30 DIAGNOSIS — B182 Chronic viral hepatitis C: Secondary | ICD-10-CM | POA: Diagnosis not present

## 2022-03-30 DIAGNOSIS — Z9049 Acquired absence of other specified parts of digestive tract: Secondary | ICD-10-CM

## 2022-03-30 NOTE — Progress Notes (Signed)
[facility]  Patient Active Problem List   Diagnosis Date Noted   Depression 12/23/2021    Priority: High   Chronic hepatitis C without hepatic coma (HCC) 12/22/2021    Priority: High   Status post umbilical hernia repair, follow-up exam 12/23/2021   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/15/2021   Obesity (BMI 30-39.9) 11/15/2021   Status post laparoscopic cholecystectomy 11/14/2021    Patient's Medications  New Prescriptions   No medications on file  Previous Medications   ACETAMINOPHEN (TYLENOL) 500 MG TABLET    Take 2 tablets (1,000 mg total) by mouth every 6 (six) hours as needed for mild pain or fever.   ALBUTEROL (VENTOLIN HFA) 108 (90 BASE) MCG/ACT INHALER    Inhale 2 puffs into the lungs every 6 (six) hours as needed for wheezing or shortness of breath.   GLECAPREVIR-PIBRENTASVIR (MAVYRET) 100-40 MG TABS    Take 3 tablets by mouth daily with breakfast.   IBUPROFEN (ADVIL) 800 MG TABLET    Take 1 tablet (800 mg total) by mouth every 8 (eight) hours as needed.   UNABLE TO FIND    Take 2 Scoops by mouth daily. Med Name: Kratom   UNABLE TO FIND    Take by mouth daily. Med Name: Tandrilax  Modified Medications   No medications on file  Discontinued Medications   No medications on file    Subjective: Chris Hamilton is in for his routine hepatitis C follow-up visit.  He started on Mavyret on 01/18/2020.  He completed therapy 1 week ago.  He recalls missing only 1 dose when he simply forgot.  His hepatitis C viral load prior to starting was 10,500.  It was undetectable on 02/22/2022.  He underwent umbilical hernia repair by Dr. Phylliss Blakes in March.  He required emergency laparoscopic cholecystectomy by Dr. Darnell Level in April.  He feels like he is developing a hernia in the incision use for his cholecystectomy.  It occasionally is uncomfortable.  Review of Systems: Review of Systems  Constitutional:  Negative for fever and weight loss.   Gastrointestinal:  Negative for abdominal pain, nausea and vomiting.  Psychiatric/Behavioral:  Negative for depression.     Past Medical History:  Diagnosis Date   Hepatitis C, chronic (HCC)    Incarcerated umbilical hernia    S/P repair by Dr. Fredricka Bonine 09/22/2021   IV drug abuse [facility])    Shingles 2022    Social History   Tobacco Use   Smoking status: Every Day    Types: E-cigarettes   Smokeless tobacco: Never   Tobacco comments:    Vapes daily. Quit cigarettes  Vaping Use   Vaping Use: Every day   Substances: Nicotine  Substance Use Topics   Alcohol use: Not Currently   Drug use: Not Currently    Types: Methamphetamines, Heroin, Cocaine, MDMA (Ecstacy), LSD, Marijuana, Amphetamines, Oxycodone, Benzodiazepines    Comment: past meth and heroin use    Family History  Problem Relation Age of Onset   Cancer Mother    Miscarriages / Stillbirths Mother    Cancer Father    Diabetes Maternal Grandmother    Cancer Maternal Grandmother    Heart disease Neg Hx     No Known Allergies  Objective: Vitals:   03/30/22 1109  BP: 129/88  Pulse: 78  Temp: 98.1 F (36.7 C)  TempSrc: Oral  SpO2: 98%  Weight: 269 lb (122 kg)  Height: 6\' 2"  (1.88  m)   Body mass index is 34.54 kg/m.  Physical Exam Constitutional:      Comments: He is in good spirits.  Cardiovascular:     Rate and Rhythm: Normal rate.  Pulmonary:     Effort: Pulmonary effort is normal.  Abdominal:     General: There is distension.     Palpations: Abdomen is soft.     Comments: He has healed incisions below his umbilicus and above in the midline.  There is a slight bulge in the upper incision.  Psychiatric:        Mood and Affect: Mood normal.     Lab Results CMP     Component Value Date/Time   NA 138 02/22/2022 1419   K 4.4 02/22/2022 1419   CL 103 02/22/2022 1419   CO2 26 02/22/2022 1419   GLUCOSE 102 (H) 02/22/2022 1419   BUN 18 02/22/2022 1419   CREATININE 0.72 02/22/2022 1419   CALCIUM  8.8 02/22/2022 1419   PROT 6.9 02/22/2022 1419   ALBUMIN 3.4 (L) 11/16/2021 0431   AST 20 02/22/2022 1419   ALT 33 02/22/2022 1419   ALT 53 (H) 12/31/2021 1105   ALKPHOS 305 (H) 11/16/2021 0431   BILITOT 0.4 02/22/2022 1419   GFRNONAA >60 11/16/2021 0431    Hepatitis C viral load 11/14/2021 10,500    Problem List Items Addressed This Visit       High   Chronic hepatitis C without hepatic coma (HCC)    He has had a good response to treatment for hepatitis C.  His liver enzymes have normalized and his viral load suppressed very rapidly.  He will get repeat lab work today and follow-up in 3 months for test of cure.      Relevant Orders   CBC   Hepatitis C RNA quantitative     Unprioritized   Status post laparoscopic cholecystectomy    I suggested that he follow-up with his surgeons regarding the possibility of a developing incisional hernia.        Cliffton Asters, MD [facility] for Infectious Disease [facility] [phone] pager   [phone] cell 03/30/2022, 11:34 AM

## 2022-03-30 NOTE — Assessment & Plan Note (Signed)
He has had a good response to treatment for hepatitis C.  His liver enzymes have normalized and his viral load suppressed very rapidly.  He will get repeat lab work today and follow-up in 3 months for test of cure.

## 2022-03-30 NOTE — Assessment & Plan Note (Signed)
I suggested that he follow-up with his surgeons regarding the possibility of a developing incisional hernia.

## 2022-04-01 LAB — CBC
HCT: 37.1 % — ABNORMAL LOW (ref 38.5–50.0)
Hemoglobin: 12.4 g/dL — ABNORMAL LOW (ref 13.2–17.1)
MCH: 28.3 pg (ref 27.0–33.0)
MCHC: 33.4 g/dL (ref 32.0–36.0)
MCV: 84.7 fL (ref 80.0–100.0)
MPV: 13.5 fL — ABNORMAL HIGH (ref 7.5–12.5)
Platelets: 161 10*3/uL (ref 140–400)
RBC: 4.38 10*6/uL (ref 4.20–5.80)
RDW: 13 % (ref 11.0–15.0)
WBC: 8.3 10*3/uL (ref 3.8–10.8)

## 2022-04-01 LAB — HEPATITIS C RNA QUANTITATIVE
HCV Quantitative Log: <1.18 log IU/mL
HCV RNA, PCR, QN: <15 IU/mL

## 2022-04-06 ENCOUNTER — Ambulatory Visit: Payer: Self-pay | Admitting: Surgery

## 2022-04-06 NOTE — H&P (View-Only) (Signed)
Chris Hamilton [id number]   Referring Provider:  Self   Subjective   Chief Complaint: Follow-up     History of Present Illness:    46 year old male known to me following open repair of incarcerated 2.5cm umbilical hernia with 6.4cm (largest available at the [facility] surgery center) mesh underlay, partial omentectomy He had an emergent laparoscopic cholecystectomy in April by Dr. Gerrit Friends, whose operative note describes significant omental adhesions to his recent umbilical hernia repair and placement of his 11 mm trocar just above this. He has developed a protrusion at the region of his trocar site which is painful.  He notes that the protrusion is most notable when he is standing, and there seems to be a component that extends to the left and inferiorly from this area.  Denies any associated GI symptoms.  Review of Systems: A complete review of systems was obtained from the patient.  I have reviewed this information and discussed as appropriate with the patient.  See HPI as well for other ROS.   Medical History: Past Medical History:  Diagnosis Date   Substance abuse (CMS-HCC)     There is no problem list on file for this patient.   Past Surgical History:  Procedure Laterality Date   Open Umbilical Hernia Repair with Mesh  09/22/2021   Dr. Fredricka Bonine   CHOLECYSTECTOMY  11/16/2021   Dr. Nat Christen     No Known Allergies  Current Outpatient Medications on File Prior to Visit  Medication Sig Dispense Refill   celecoxib (CELEBREX) 200 MG capsule Take 200 mg by mouth 2 (two) times daily (Patient not taking: Reported on 04/06/2022)     No current facility-administered medications on file prior to visit.    Family History  Problem Relation Age of Onset   Breast cancer Mother    Diabetes Maternal Grandmother    High blood pressure (Hypertension) Maternal Grandfather      Social History   Tobacco Use  Smoking Status Former   Types: Cigarettes   Quit date: 04/2021   Years  since quitting: 0.9  Smokeless Tobacco Never     Social History   Socioeconomic History   Marital status: Single  Tobacco Use   Smoking status: Former    Types: Cigarettes    Quit date: 04/2021    Years since quitting: 0.9   Smokeless tobacco: Never  Substance and Sexual Activity   Alcohol use: Never   Drug use: Never    Objective:    There were no vitals filed for this visit.  There is no height or weight on file to calculate BMI.  Alert and well-appearing Unlabored respirations Abdomen is soft, nontender, nondistended.  He has a fairly broad upper midline diastases, within which, just underneath his 11 mm trocar site, is a vaguely palpable partially reducible mass consistent with an incisional hernia.  Fascial defect is not palpable.  He is standing, this does appear to protrude a fairly broad distance surrounding the 11 mm trocar site into the left.   Assessment and Plan:  Diagnoses and all orders for this visit:  Incisional hernia without obstruction or gangrene  We discussed options for repair.  Given that this is symptomatic, I do recommend proceeding with a laparoscopic assisted repair.  We discussed the procedure including risks of bleeding, infection, pain, scarring, injury to intra-abdominal structures, hernia recurrence, and we discussed that the midline diastases would not be repaired or affected by surgery but that this may be addressed with physical therapy going  forward.  Questions were welcomed and answered to his satisfaction.  We will plan to schedule at his convenience.    Jeanmarc Viernes Carlye Grippe, MD

## 2022-04-06 NOTE — H&P (Signed)
Chris Hamilton [id number]   Referring Provider:  Self   Subjective   Chief Complaint: Follow-up     History of Present Illness:    46 year old male known to me following open repair of incarcerated 2.5cm umbilical hernia with 6.4cm (largest available at the [facility] surgery center) mesh underlay, partial omentectomy He had an emergent laparoscopic cholecystectomy in April by Dr. Gerrit Friends, whose operative note describes significant omental adhesions to his recent umbilical hernia repair and placement of his 11 mm trocar just above this. He has developed a protrusion at the region of his trocar site which is painful.  He notes that the protrusion is most notable when he is standing, and there seems to be a component that extends to the left and inferiorly from this area.  Denies any associated GI symptoms.  Review of Systems: A complete review of systems was obtained from the patient.  I have reviewed this information and discussed as appropriate with the patient.  See HPI as well for other ROS.   Medical History: Past Medical History:  Diagnosis Date   Substance abuse (CMS-HCC)     There is no problem list on file for this patient.   Past Surgical History:  Procedure Laterality Date   Open Umbilical Hernia Repair with Mesh  09/22/2021   Dr. Fredricka Bonine   CHOLECYSTECTOMY  11/16/2021   Dr. Nat Christen     No Known Allergies  Current Outpatient Medications on File Prior to Visit  Medication Sig Dispense Refill   celecoxib (CELEBREX) 200 MG capsule Take 200 mg by mouth 2 (two) times daily (Patient not taking: Reported on 04/06/2022)     No current facility-administered medications on file prior to visit.    Family History  Problem Relation Age of Onset   Breast cancer Mother    Diabetes Maternal Grandmother    High blood pressure (Hypertension) Maternal Grandfather      Social History   Tobacco Use  Smoking Status Former   Types: Cigarettes   Quit date: 04/2021   Years  since quitting: 0.9  Smokeless Tobacco Never     Social History   Socioeconomic History   Marital status: Single  Tobacco Use   Smoking status: Former    Types: Cigarettes    Quit date: 04/2021    Years since quitting: 0.9   Smokeless tobacco: Never  Substance and Sexual Activity   Alcohol use: Never   Drug use: Never    Objective:    There were no vitals filed for this visit.  There is no height or weight on file to calculate BMI.  Alert and well-appearing Unlabored respirations Abdomen is soft, nontender, nondistended.  He has a fairly broad upper midline diastases, within which, just underneath his 11 mm trocar site, is a vaguely palpable partially reducible mass consistent with an incisional hernia.  Fascial defect is not palpable.  He is standing, this does appear to protrude a fairly broad distance surrounding the 11 mm trocar site into the left.   Assessment and Plan:  Diagnoses and all orders for this visit:  Incisional hernia without obstruction or gangrene  We discussed options for repair.  Given that this is symptomatic, I do recommend proceeding with a laparoscopic assisted repair.  We discussed the procedure including risks of bleeding, infection, pain, scarring, injury to intra-abdominal structures, hernia recurrence, and we discussed that the midline diastases would not be repaired or affected by surgery but that this may be addressed with physical therapy going  forward.  Questions were welcomed and answered to his satisfaction.  We will plan to schedule at his convenience.    Yida Hyams AMANDA Mosi Hannold, MD   

## 2022-04-21 NOTE — Patient Instructions (Signed)
DUE TO COVID-19 ONLY TWO VISITORS  (aged 46 and older)  ARE ALLOWED TO COME WITH YOU AND STAY IN THE WAITING ROOM ONLY DURING PRE OP AND PROCEDURE.   **NO VISITORS ARE ALLOWED IN THE SHORT STAY AREA OR RECOVERY ROOM!!**  IF YOU WILL BE ADMITTED INTO THE HOSPITAL YOU ARE ALLOWED ONLY FOUR SUPPORT PEOPLE DURING VISITATION HOURS ONLY (7 AM -8PM)   The support person(s) must pass our screening, gel in and out, and wear a mask at all times, including in the patient's room. Patients must also wear a mask when staff or their support person are in the room. Visitors GUEST BADGE MUST BE WORN VISIBLY  One adult visitor may remain with you overnight and MUST be in the room by 8 P.M.     Your procedure is scheduled on: 05/03/22   Report to [facility] Main Entrance    Report to admitting at : 5:30 AM   Call this number if you have problems the morning of surgery [phone]   Do not eat food :After Midnight.   After Midnight you may have the following liquids until : 4:30 AM DAY OF SURGERY  Water Black Coffee (sugar ok, NO MILK/CREAM OR CREAMERS)  Tea (sugar ok, NO MILK/CREAM OR CREAMERS) regular and decaf                             Plain Jell-O (NO RED)                                           Fruit ices (not with fruit pulp, NO RED)                                     Popsicles (NO RED)                                                                  Juice: apple, WHITE grape, WHITE cranberry Sports drinks like Gatorade (NO RED)              Oral Hygiene is also important to reduce your risk of infection.                                    Remember - BRUSH YOUR TEETH THE MORNING OF SURGERY WITH YOUR REGULAR TOOTHPASTE   Do NOT smoke after Midnight   Take these medicines the morning of surgery with A SIP OF WATER: N/A  DO NOT TAKE ANY ORAL DIABETIC MEDICATIONS DAY OF YOUR SURGERY  Bring CPAP mask and tubing day of surgery.                              You may not have any  metal on your body including hair pins, jewelry, and body piercing             Do not wear lotions, powders, perfumes/cologne, or  deodorant              Men may shave face and neck.   Do not bring valuables to the hospital. [facility] IS NOT             RESPONSIBLE   FOR VALUABLES.   Contacts, dentures or bridgework may not be worn into surgery.   Bring small overnight bag day of surgery.   DO NOT BRING YOUR HOME MEDICATIONS TO THE HOSPITAL. PHARMACY WILL DISPENSE MEDICATIONS LISTED ON YOUR MEDICATION LIST TO YOU DURING YOUR ADMISSION IN THE HOSPITAL!    Patients discharged on the day of surgery will not be allowed to drive home.  Someone NEEDS to stay with you for the first 24 hours after anesthesia.   Special Instructions: Bring a copy of your healthcare power of attorney and living will documents         the day of surgery if you haven't scanned them before.              Please read over the following fact sheets you were given: IF YOU HAVE QUESTIONS ABOUT YOUR PRE-OP INSTRUCTIONS PLEASE CALL [phone]     [facility] - Preparing for Surgery Before surgery, you can play an important role.  Because skin is not sterile, your skin needs to be as free of germs as possible.  You can reduce the number of germs on your skin by washing with CHG (chlorahexidine gluconate) soap before surgery.  CHG is an antiseptic cleaner which kills germs and bonds with the skin to continue killing germs even after washing. Please DO NOT use if you have an allergy to CHG or antibacterial soaps.  If your skin becomes reddened/irritated stop using the CHG and inform your nurse when you arrive at Short Stay. Do not shave (including legs and underarms) for at least 48 hours prior to the first CHG shower.  You may shave your face/neck. Please follow these instructions carefully:  1.  Shower with CHG Soap the night before surgery and the  morning of Surgery.  2.  If you choose to wash your hair, wash your hair  first as usual with your  normal  shampoo.  3.  After you shampoo, rinse your hair and body thoroughly to remove the  shampoo.                           4.  Use CHG as you would any other liquid soap.  You can apply chg directly  to the skin and wash                       Gently with a scrungie or clean washcloth.  5.  Apply the CHG Soap to your body ONLY FROM THE NECK DOWN.   Do not use on face/ open                           Wound or open sores. Avoid contact with eyes, ears mouth and genitals (private parts).                       Wash face,  Genitals (private parts) with your normal soap.             6.  Wash thoroughly, paying special attention to the area where your surgery  will be performed.  7.  Thoroughly rinse your body with warm water from the neck down.  8.  DO NOT shower/wash with your normal soap after using and rinsing off  the CHG Soap.                9.  Pat yourself dry with a clean towel.            10.  Wear clean pajamas.            11.  Place clean sheets on your bed the night of your first shower and do not  sleep with pets. Day of Surgery : Do not apply any lotions/deodorants the morning of surgery.  Please wear clean clothes to the hospital/surgery center.  FAILURE TO FOLLOW THESE INSTRUCTIONS MAY RESULT IN THE CANCELLATION OF YOUR SURGERY PATIENT SIGNATURE_________________________________  NURSE SIGNATURE__________________________________  ________________________________________________________________________

## 2022-04-25 ENCOUNTER — Encounter (HOSPITAL_COMMUNITY): Payer: Self-pay

## 2022-04-25 ENCOUNTER — Encounter (HOSPITAL_COMMUNITY)
Admission: RE | Admit: 2022-04-25 | Discharge: 2022-04-25 | Disposition: A | Discharge status: Home / Self Care | Payer: BC Managed Care – PPO | Source: Ambulatory Visit | Attending: Surgery | Admitting: Surgery

## 2022-04-25 ENCOUNTER — Other Ambulatory Visit: Payer: Self-pay

## 2022-04-25 VITALS — BP 128/97 | HR 82 | Temp 98.7°F | Ht 74.0 in | Wt 268.0 lb

## 2022-04-25 DIAGNOSIS — Z01818 Encounter for other preprocedural examination: Secondary | ICD-10-CM

## 2022-04-25 DIAGNOSIS — Z01812 Encounter for preprocedural laboratory examination: Secondary | ICD-10-CM | POA: Insufficient documentation

## 2022-04-25 HISTORY — DX: Depression, unspecified: F32.A

## 2022-04-25 HISTORY — DX: Unspecified osteoarthritis, unspecified site: M19.90

## 2022-04-25 HISTORY — DX: Anxiety disorder, unspecified: F41.9

## 2022-04-25 LAB — CBC
HCT: 39 % (ref 39.0–52.0)
Hemoglobin: 12.6 g/dL — ABNORMAL LOW (ref 13.0–17.0)
MCH: 28.3 pg (ref 26.0–34.0)
MCHC: 32.3 g/dL (ref 30.0–36.0)
MCV: 87.6 fL (ref 80.0–100.0)
Platelets: 165 10*3/uL (ref 150–400)
RBC: 4.45 MIL/uL (ref 4.22–5.81)
RDW: 13 % (ref 11.5–15.5)
WBC: 8 10*3/uL (ref 4.0–10.5)
nRBC: 0 % (ref 0.0–0.2)

## 2022-04-25 NOTE — Progress Notes (Signed)
For Short Stay: [place] appointment date: Date of COVID positive in last 64 days:  Bowel Prep reminder:   For Anesthesia: PCP - Sherlene Shams: FNP Cardiologist -   Chest x-ray -  EKG - 11/16/21 Stress Test -  ECHO -  Cardiac Cath -  Pacemaker/ICD device last checked: Pacemaker orders received: Device Rep notified:  Spinal Cord Stimulator:  Sleep Study -  CPAP -   Fasting Blood Sugar -  Checks Blood Sugar _____ times a day Date and result of last Hgb A1c-  Blood Thinner Instructions: Aspirin Instructions: Last Dose:  Activity level: Can go up a flight of stairs and activities of daily living without stopping and without chest pain and/or shortness of breath   Able to exercise without chest pain and/or shortness of breath   Unable to go up a flight of stairs without chest pain and/or shortness of breath     Anesthesia review: Hx: smoker(vaper)  Patient denies shortness of breath, fever, cough and chest pain at PAT appointment   Patient verbalized understanding of instructions that were given to them at the PAT appointment. Patient was also instructed that they will need to review over the PAT instructions again at home before surgery.

## 2022-05-02 NOTE — Anesthesia Preprocedure Evaluation (Signed)
Anesthesia Evaluation  Patient identified by MRN, date of birth, ID band Patient awake    Reviewed: Allergy & Precautions, NPO status , Patient's Chart, lab work & pertinent test results  Airway Mallampati: II  TM Distance: >3 FB Neck ROM: Full    Dental  (+) Loose,    Pulmonary neg pulmonary ROS, Current Smoker and Patient abstained from smoking.  Pulmonary exam normal         Cardiovascular negative cardio ROS  Rhythm:Regular Rate:Normal     Neuro/Psych   Anxiety Depression    negative neurological ROS     GI/Hepatic ,,,(+)     substance abuse  IV drug use, Hepatitis -, CUmbilical hernia    Endo/Other  negative endocrine ROS    Renal/GU negative Renal ROS  negative genitourinary   Musculoskeletal  (+) Arthritis , Osteoarthritis,    Abdominal Normal abdominal exam  (+)   Peds  Hematology negative hematology ROS (+)   Anesthesia Other Findings   Reproductive/Obstetrics                            Anesthesia Physical Anesthesia Plan  ASA: 3  Anesthesia Plan: General   Post-op Pain Management: Celebrex PO (pre-op)*, Tylenol PO (pre-op)* and Gabapentin PO (pre-op)*   Induction: Intravenous  PONV Risk Score and Plan: 1 and Ondansetron, Dexamethasone, Midazolam and Treatment may vary due to age or medical condition  Airway Management Planned: Mask and Oral ETT  Additional Equipment: None  Intra-op Plan:   Post-operative Plan: Extubation in OR  I have reviewed the patients History and Physical, chart, labs and discussed the procedure including the risks, benefits and alternatives for the proposed anesthesia with the patient or authorized representative who has indicated his/her understanding and acceptance.     Dental advisory givenInformed Consent:   CRNAPlan Discussed with:   Anesthesia Plan Comments: (Lab Results      Component                Value               Date                       WBC                      8.0                 04/25/2022                HGB                      12.6 (L)            04/25/2022                HCT                      39.0                04/25/2022                MCV                      87.6                04/25/2022  PLT                      165                 04/25/2022            Lab Results      Component                Value               Date                      NA                       138                 02/22/2022                K                        4.4                 02/22/2022                CO2                      26                  02/22/2022                GLUCOSE                  102 (H)             02/22/2022                BUN                      18                  02/22/2022                CREATININE               0.72                02/22/2022                CALCIUM                  8.8                 02/22/2022                GFRNONAA                 >60                 11/16/2021           )      Anesthesia Quick Evaluation

## 2022-05-03 ENCOUNTER — Other Ambulatory Visit: Payer: Self-pay

## 2022-05-03 ENCOUNTER — Ambulatory Visit (HOSPITAL_COMMUNITY)
Admission: RE | Admit: 2022-05-03 | Discharge: 2022-05-03 | Disposition: A | Discharge status: Home / Self Care | Payer: BC Managed Care – PPO | Source: Ambulatory Visit | Attending: Surgery | Admitting: Surgery

## 2022-05-03 ENCOUNTER — Encounter (HOSPITAL_COMMUNITY): Payer: Self-pay | Admitting: Surgery

## 2022-05-03 ENCOUNTER — Encounter (HOSPITAL_COMMUNITY)
Admission: RE | Disposition: A | Discharge status: Home / Self Care | Payer: Self-pay | Source: Ambulatory Visit | Attending: Surgery

## 2022-05-03 ENCOUNTER — Ambulatory Visit (HOSPITAL_COMMUNITY): Payer: BC Managed Care – PPO

## 2022-05-03 DIAGNOSIS — Z9049 Acquired absence of other specified parts of digestive tract: Secondary | ICD-10-CM | POA: Insufficient documentation

## 2022-05-03 DIAGNOSIS — F17210 Nicotine dependence, cigarettes, uncomplicated: Secondary | ICD-10-CM | POA: Diagnosis not present

## 2022-05-03 DIAGNOSIS — K430 Incisional hernia with obstruction, without gangrene: Secondary | ICD-10-CM | POA: Diagnosis present

## 2022-05-03 DIAGNOSIS — Z9889 Other specified postprocedural states: Secondary | ICD-10-CM | POA: Insufficient documentation

## 2022-05-03 HISTORY — PX: INCISIONAL HERNIA REPAIR: SHX193

## 2022-05-03 SURGERY — REPAIR, HERNIA, INCISIONAL, LAPAROSCOPIC
Anesthesia: General

## 2022-05-03 MED ORDER — CEFAZOLIN SODIUM 1 G IJ SOLR
INTRAMUSCULAR | Status: AC
  Filled 2022-05-03: qty 10

## 2022-05-03 MED ORDER — KETAMINE HCL 10 MG/ML IJ SOLN
INTRAMUSCULAR | Status: DC | PRN
  Administered 2022-05-03: 40 mg via INTRAVENOUS

## 2022-05-03 MED ORDER — CEFAZOLIN SODIUM-DEXTROSE 1-4 GM/50ML-% IV SOLN
1.0000 g | Freq: Once | INTRAVENOUS | Status: AC
  Administered 2022-05-03: 1 g via INTRAVENOUS

## 2022-05-03 MED ORDER — DEXAMETHASONE SODIUM PHOSPHATE 10 MG/ML IJ SOLN
INTRAMUSCULAR | Status: DC | PRN
  Administered 2022-05-03: 5 mg via INTRAVENOUS

## 2022-05-03 MED ORDER — CELECOXIB 200 MG PO CAPS
200.0000 mg | ORAL_CAPSULE | Freq: Once | ORAL | Status: AC
  Administered 2022-05-03: 200 mg via ORAL
  Filled 2022-05-03: qty 1

## 2022-05-03 MED ORDER — CEFAZOLIN (ANCEF) 1 G IV SOLR: 1.0000 g | INTRAVENOUS | Status: DC

## 2022-05-03 MED ORDER — ACETAMINOPHEN 650 MG RE SUPP
650.0000 mg | RECTAL | Status: DC | PRN
  Filled 2022-05-03: qty 1

## 2022-05-03 MED ORDER — MIDAZOLAM HCL 2 MG/2ML IJ SOLN
INTRAMUSCULAR | Status: DC | PRN
  Administered 2022-05-03: 2 mg via INTRAVENOUS

## 2022-05-03 MED ORDER — SUCCINYLCHOLINE CHLORIDE 200 MG/10ML IV SOSY
PREFILLED_SYRINGE | INTRAVENOUS | Status: DC | PRN
  Administered 2022-05-03: 140 mg via INTRAVENOUS

## 2022-05-03 MED ORDER — CEFAZOLIN SODIUM-DEXTROSE 2-4 GM/100ML-% IV SOLN
2.0000 g | INTRAVENOUS | Status: AC
  Administered 2022-05-03: 2 g via INTRAVENOUS
  Filled 2022-05-03: qty 100

## 2022-05-03 MED ORDER — MIDAZOLAM HCL 2 MG/2ML IJ SOLN
INTRAMUSCULAR | Status: AC
  Filled 2022-05-03: qty 2

## 2022-05-03 MED ORDER — CHLORHEXIDINE GLUCONATE 0.12 % MT SOLN
15.0000 mL | Freq: Once | OROMUCOSAL | Status: AC
  Administered 2022-05-03: 15 mL via OROMUCOSAL

## 2022-05-03 MED ORDER — BUPIVACAINE LIPOSOME 1.3 % IJ SUSP
INTRAMUSCULAR | Status: DC | PRN
  Administered 2022-05-03: 20 mL

## 2022-05-03 MED ORDER — 0.9 % SODIUM CHLORIDE (POUR BTL) OPTIME
TOPICAL | Status: DC | PRN
  Administered 2022-05-03: 1000 mL

## 2022-05-03 MED ORDER — SUCCINYLCHOLINE CHLORIDE 200 MG/10ML IV SOSY
PREFILLED_SYRINGE | INTRAVENOUS | Status: AC
  Filled 2022-05-03: qty 10

## 2022-05-03 MED ORDER — SUGAMMADEX SODIUM 500 MG/5ML IV SOLN
INTRAVENOUS | Status: DC | PRN
  Administered 2022-05-03: 300 mg via INTRAVENOUS

## 2022-05-03 MED ORDER — ONDANSETRON HCL 4 MG/2ML IJ SOLN
INTRAMUSCULAR | Status: AC
  Filled 2022-05-03: qty 2

## 2022-05-03 MED ORDER — SODIUM CHLORIDE 0.9% FLUSH: 3.0000 mL | Freq: Two times a day (BID) | INTRAVENOUS | Status: DC

## 2022-05-03 MED ORDER — FENTANYL CITRATE PF 50 MCG/ML IJ SOSY
25.0000 ug | PREFILLED_SYRINGE | INTRAMUSCULAR | Status: DC | PRN
  Administered 2022-05-03 (×2): 50 ug via INTRAVENOUS

## 2022-05-03 MED ORDER — SUGAMMADEX SODIUM 500 MG/5ML IV SOLN
INTRAVENOUS | Status: AC
  Filled 2022-05-03: qty 5

## 2022-05-03 MED ORDER — BUPIVACAINE LIPOSOME 1.3 % IJ SUSP: 20.0000 mL | Freq: Once | INTRAMUSCULAR | Status: DC

## 2022-05-03 MED ORDER — SODIUM CHLORIDE (PF) 0.9 % IJ SOLN
INTRAMUSCULAR | Status: AC
  Filled 2022-05-03: qty 20

## 2022-05-03 MED ORDER — LIDOCAINE HCL (PF) 2 % IJ SOLN
INTRAMUSCULAR | Status: AC
  Filled 2022-05-03: qty 5

## 2022-05-03 MED ORDER — FENTANYL CITRATE (PF) 100 MCG/2ML IJ SOLN
INTRAMUSCULAR | Status: AC
  Filled 2022-05-03: qty 2

## 2022-05-03 MED ORDER — BUPIVACAINE-EPINEPHRINE 0.25% -1:200000 IJ SOLN
INTRAMUSCULAR | Status: DC | PRN
  Administered 2022-05-03: 30 mL

## 2022-05-03 MED ORDER — ACETAMINOPHEN 325 MG PO TABS: 650.0000 mg | ORAL_TABLET | ORAL | Status: DC | PRN

## 2022-05-03 MED ORDER — BUPIVACAINE-EPINEPHRINE (PF) 0.25% -1:200000 IJ SOLN
INTRAMUSCULAR | Status: AC
  Filled 2022-05-03: qty 30

## 2022-05-03 MED ORDER — CHLORHEXIDINE GLUCONATE 4 % EX LIQD: 60.0000 mL | Freq: Once | CUTANEOUS | Status: DC

## 2022-05-03 MED ORDER — FENTANYL CITRATE (PF) 250 MCG/5ML IJ SOLN
INTRAMUSCULAR | Status: DC | PRN
  Administered 2022-05-03 (×2): 100 ug via INTRAVENOUS
  Administered 2022-05-03: 50 ug via INTRAVENOUS
  Administered 2022-05-03: 100 ug via INTRAVENOUS

## 2022-05-03 MED ORDER — BUPIVACAINE LIPOSOME 1.3 % IJ SUSP
INTRAMUSCULAR | Status: AC
  Filled 2022-05-03: qty 20

## 2022-05-03 MED ORDER — ACETAMINOPHEN 500 MG PO TABS: 1000.0000 mg | ORAL_TABLET | ORAL | Status: DC

## 2022-05-03 MED ORDER — PROPOFOL 10 MG/ML IV BOLUS
INTRAVENOUS | Status: AC
  Filled 2022-05-03: qty 20

## 2022-05-03 MED ORDER — ROCURONIUM BROMIDE 10 MG/ML (PF) SYRINGE
PREFILLED_SYRINGE | INTRAVENOUS | Status: AC
  Filled 2022-05-03: qty 10

## 2022-05-03 MED ORDER — ACETAMINOPHEN 500 MG PO TABS
1000.0000 mg | ORAL_TABLET | Freq: Once | ORAL | Status: AC
  Administered 2022-05-03: 1000 mg via ORAL
  Filled 2022-05-03: qty 2

## 2022-05-03 MED ORDER — FENTANYL CITRATE PF 50 MCG/ML IJ SOSY
PREFILLED_SYRINGE | INTRAMUSCULAR | Status: AC
  Filled 2022-05-03: qty 1

## 2022-05-03 MED ORDER — GABAPENTIN 300 MG PO CAPS
300.0000 mg | ORAL_CAPSULE | ORAL | Status: AC
  Administered 2022-05-03: 300 mg via ORAL
  Filled 2022-05-03: qty 1

## 2022-05-03 MED ORDER — ROCURONIUM BROMIDE 10 MG/ML (PF) SYRINGE
PREFILLED_SYRINGE | INTRAVENOUS | Status: DC | PRN
  Administered 2022-05-03 (×2): 20 mg via INTRAVENOUS
  Administered 2022-05-03: 10 mg via INTRAVENOUS
  Administered 2022-05-03: 50 mg via INTRAVENOUS

## 2022-05-03 MED ORDER — OXYCODONE HCL 5 MG PO TABS
ORAL_TABLET | ORAL | Status: AC
  Filled 2022-05-03: qty 2

## 2022-05-03 MED ORDER — DEXAMETHASONE SODIUM PHOSPHATE 10 MG/ML IJ SOLN
INTRAMUSCULAR | Status: AC
  Filled 2022-05-03: qty 1

## 2022-05-03 MED ORDER — OXYCODONE HCL 5 MG PO TABS: 5.0000 mg | ORAL_TABLET | Freq: Three times a day (TID) | ORAL | 0 refills | Status: AC | PRN

## 2022-05-03 MED ORDER — SODIUM CHLORIDE 0.9 % IV SOLN: 250.0000 mL | INTRAVENOUS | Status: DC | PRN

## 2022-05-03 MED ORDER — OXYCODONE HCL 5 MG PO TABS
5.0000 mg | ORAL_TABLET | ORAL | Status: DC | PRN
  Administered 2022-05-03: 10 mg via ORAL

## 2022-05-03 MED ORDER — LIDOCAINE 2% (20 MG/ML) 5 ML SYRINGE
INTRAMUSCULAR | Status: DC | PRN
  Administered 2022-05-03: 60 mg via INTRAVENOUS

## 2022-05-03 MED ORDER — FENTANYL CITRATE PF 50 MCG/ML IJ SOSY: 25.0000 ug | PREFILLED_SYRINGE | INTRAMUSCULAR | Status: DC | PRN

## 2022-05-03 MED ORDER — SODIUM CHLORIDE 0.9% FLUSH: 3.0000 mL | INTRAVENOUS | Status: DC | PRN

## 2022-05-03 MED ORDER — ORAL CARE MOUTH RINSE: 15.0000 mL | Freq: Once | OROMUCOSAL | Status: AC

## 2022-05-03 MED ORDER — DOCUSATE SODIUM 100 MG PO CAPS: 100.0000 mg | ORAL_CAPSULE | Freq: Two times a day (BID) | ORAL | 0 refills | Status: AC

## 2022-05-03 MED ORDER — PROPOFOL 10 MG/ML IV BOLUS
INTRAVENOUS | Status: DC | PRN
  Administered 2022-05-03: 200 mg via INTRAVENOUS

## 2022-05-03 MED ORDER — FENTANYL CITRATE (PF) 250 MCG/5ML IJ SOLN
INTRAMUSCULAR | Status: AC
  Filled 2022-05-03: qty 5

## 2022-05-03 MED ORDER — LACTATED RINGERS IV SOLN: INTRAVENOUS | Status: DC

## 2022-05-03 MED ORDER — KETAMINE HCL 50 MG/5ML IJ SOSY
PREFILLED_SYRINGE | INTRAMUSCULAR | Status: AC
  Filled 2022-05-03: qty 5

## 2022-05-03 MED ORDER — ONDANSETRON HCL 4 MG/2ML IJ SOLN
INTRAMUSCULAR | Status: DC | PRN
  Administered 2022-05-03: 4 mg via INTRAVENOUS

## 2022-05-03 SURGICAL SUPPLY — 44 items
ADH SKN CLS APL DERMABOND .7 (GAUZE/BANDAGES/DRESSINGS) ×1
APL PRP STRL LF DISP 70% ISPRP (MISCELLANEOUS) ×1
APL SKNCLS STERI-STRIP NONHPOA (GAUZE/BANDAGES/DRESSINGS) ×1
BAG COUNTER SPONGE SURGICOUNT (BAG) IMPLANT
BAG SPNG CNTER NS LX DISP (BAG)
BENZOIN TINCTURE PRP APPL 2/3 (GAUZE/BANDAGES/DRESSINGS) IMPLANT
BINDER ABDOMINAL 12 ML 46-62 (SOFTGOODS) IMPLANT
CABLE HIGH FREQUENCY MONO STRZ (ELECTRODE) ×1 IMPLANT
CHLORAPREP W/TINT 26 (MISCELLANEOUS) ×1 IMPLANT
COVER SURGICAL LIGHT HANDLE (MISCELLANEOUS) ×2 IMPLANT
DERMABOND ADVANCED .7 DNX12 (GAUZE/BANDAGES/DRESSINGS) ×1 IMPLANT
DEVICE SECURE STRAP 25 ABSORB (INSTRUMENTS) IMPLANT
ELECT REM PT RETURN 15FT ADLT (MISCELLANEOUS) ×1 IMPLANT
GLOVE BIO SURGEON STRL SZ 6 (GLOVE) ×1 IMPLANT
GLOVE INDICATOR 6.5 STRL GRN (GLOVE) ×2 IMPLANT
GLOVE SS BIOGEL STRL SZ 6 (GLOVE) ×1 IMPLANT
GOWN STRL REUS W/ TWL LRG LVL3 (GOWN DISPOSABLE) ×2 IMPLANT
GOWN STRL REUS W/ TWL XL LVL3 (GOWN DISPOSABLE) IMPLANT
GOWN STRL REUS W/TWL LRG LVL3 (GOWN DISPOSABLE) ×1
GOWN STRL REUS W/TWL XL LVL3 (GOWN DISPOSABLE)
GRASPER SUT TROCAR 14GX15 (MISCELLANEOUS) ×1 IMPLANT
IRRIG SUCT STRYKERFLOW 2 WTIP (MISCELLANEOUS)
IRRIGATION SUCT STRKRFLW 2 WTP (MISCELLANEOUS) IMPLANT
KIT BASIN OR (CUSTOM PROCEDURE TRAY) ×2 IMPLANT
KIT TURNOVER KIT A (KITS) IMPLANT
MARKER SKIN DUAL TIP RULER LAB (MISCELLANEOUS) ×1 IMPLANT
MESH VENTRALIGHT ST 4X6IN (Mesh General) IMPLANT
PENCIL SMOKE EVACUATOR (MISCELLANEOUS) IMPLANT
SCISSORS LAP 5X35 DISP (ENDOMECHANICALS) ×1 IMPLANT
SET TUBE SMOKE EVAC HIGH FLOW (TUBING) ×1 IMPLANT
SHEARS HARMONIC ACE PLUS 36CM (ENDOMECHANICALS) IMPLANT
SLEEVE Z-THREAD 5X100MM (TROCAR) ×1 IMPLANT
SPIKE FLUID TRANSFER (MISCELLANEOUS) IMPLANT
STRIP CLOSURE SKIN 1/2X4 (GAUZE/BANDAGES/DRESSINGS) ×2 IMPLANT
SUT ETHIBOND 0 (SUTURE) ×4 IMPLANT
SUT MNCRL AB 4-0 PS2 18 (SUTURE) ×1 IMPLANT
SUT NOVA 0 T19/GS 22DT (SUTURE) IMPLANT
SUT VIC AB 3-0 SH 27 (SUTURE) ×1
SUT VIC AB 3-0 SH 27XBRD (SUTURE) IMPLANT
TOWEL OR 17X26 10 PK STRL BLUE (TOWEL DISPOSABLE) ×2 IMPLANT
TRAY LAPAROSCOPIC (CUSTOM PROCEDURE TRAY) ×1 IMPLANT
TROCAR ADV FIXATION 12X100MM (TROCAR) ×1 IMPLANT
TROCAR XCEL NON-BLD 5MMX100MML (ENDOMECHANICALS) IMPLANT
TROCAR Z-THREAD OPTICAL 5X100M (TROCAR) ×1 IMPLANT

## 2022-05-03 NOTE — Transfer of Care (Signed)
Immediate Anesthesia Transfer of Care Note  Patient: Chris Hamilton  Procedure(s) Performed: LAPAROSCOPIC INCISIONAL/VENTRAL HERNIA REPAIR  Patient Location: PACU  Anesthesia Type:General  Level of Consciousness: awake, alert , oriented and patient cooperative  Airway & Oxygen Therapy: Patient Spontanous Breathing and Patient connected to face mask oxygen  Post-op Assessment: Report given to RN and Post -op Vital signs reviewed and stable  Post vital signs: Reviewed and stable  Last Vitals:  Vitals Value Taken Time  BP 146/85 05/03/22 0925  Temp    Pulse 71 05/03/22 0927  Resp 15 05/03/22 0927  SpO2 100 % 05/03/22 0927  Vitals shown include unvalidated device data.  Last Pain:  Vitals:   05/03/22 0619  TempSrc: Oral  PainSc:       Patients Stated Pain Goal: 3 (20/10/07 1219)  Complications:  Encounter Notable Events  Notable Event Outcome Phase Comment  Difficult to intubate - expected  Intraprocedure Filed from anesthesia note documentation.

## 2022-05-03 NOTE — Op Note (Signed)
Operative Note  Chris Hamilton  [id number]  [id number]  05/03/2022   Surgeon: Romana Juniper MD FACS I was personally present and scrubbed in during the key and critical portions of this procedure and immediately available throughout the entire procedure, as documented in my operative note.    Assistant: Lossie Faes MD ([id number])   Procedure performed: Laparoscopic repair of ventral incisional hernia, incarcerated containing omentum, total defect size 8 cm x 4 cm   Preop diagnosis: Incisional hernia Post-op diagnosis/intraop findings: Swiss cheese incisional hernia defect just superior to the umbilicus in the region of his prior incision from gallbladder surgery with 3 defects, total measuring 8 cm x 4 cm containing incarcerated omentum.  Intact prior umbilical hernia repair with well incorporated mesh   Specimens: No Retained items: no  EBL: Minimal cc Complications: none   Description of procedure: After obtaining informed consent the patient was taken to the operating room and placed supine on operating room table where general endotracheal anesthesia was initiated, preoperative antibiotics were administered, SCDs applied, and a formal timeout was performed.  The abdomen was prepped and draped in usual sterile fashion.  Peritoneal access was gained with optical entry in the left upper quadrant and insufflation to 94mmHg ensued without incident.  The abdomen was inspected, omental adhesions were noted in the central anterior abdominal wall and there was no evidence of injury or other intra-abdominal abnormality.  Bilateral laparoscopic assisted tap blocks were performed with Exparel mixed with quarter percent Marcaine with epinephrine.  2 additional 5 mm trocars were placed in the left hemiabdomen under direct visualization.  A combination of harmonic scalpel and blunt dissection was used to free omental adhesions from the anterior abdominal wall and the majority of the incarcerated omentum  was able to be reduced from within the hernia sacs.  On completion, we noted that there were 3 defects connected by narrow bridges of fascia, total defect approximately 8 x 4 cm, in the region of his prior supraumbilical incision.  The prior umbilical hernia repair appears intact with well incorporated mesh that is not well visible due to being encased in peritoneum and preperitoneal fat.  The reduced omentum was inspected and confirmed to be hemostatic.  The adjacent colon was also carefully inspected and confirmed to be free of any injury or abnormality.  At this juncture a small vertical midline incision was created over the hernia sac and then a combination of cautery and blunt dissection were used to free the bilobed hernia sac from surrounding soft tissue.  The hernia sac was excised and the narrow fascial bridge between the hernia defects was divided to create a single fascial defect.  The fascia was cleared off circumferentially and hemostasis ensured within the wound.  A 4 inch x 6 inch Ventralight mesh was selected and stay sutures placed in the 4 cardinal directions using 0 Ethibonds.  The rough surface was marked for orientation and the mesh was folded up and inserted into the abdominal cavity through the hernia defect.  The fascial defect was then closed with interrupted figure-of-eight #1 Novafils.  The abdomen was reinsufflated and the fascial repair inspected and confirmed to be airtight with no entrapped structures.  The previously placed mesh was unfolded and the stay sutures were brought through the abdominal wall for transfascial fixation of the mesh in the 4 cardinal directions and tied down, noting complete overlap with at least 5 cm circumferentially surrounding the hernia repair as well as some overlap of the umbilicus.  The secure strap tacker was then used to further affix the mesh to the anterior abdominal wall circumferentially.  An inner crown of tacks was also placed.  The abdominal  cavity was then reinspected and hemostasis confirmed.  The abdomen was then desufflated and trocars removed.  The midline incision was closed with 3-0 Vicryl in the deep soft tissue as well as interrupted deep dermal 3-0 Vicryl's.  All skin incisions were then closed with subcuticular 4 Monocryl.  Benzoin, Steri-Strips, and sterile dressings were then applied.  The patient was then awakened, extubated and taken to PACU in stable condition.    All counts were correct at the completion of the case.

## 2022-05-03 NOTE — Interval H&P Note (Signed)
History and Physical Interval Note:  05/03/2022 7:02 AM  Chris Hamilton  has presented today for surgery, with the diagnosis of HERNIA.  The various methods of treatment have been discussed with the patient and family. After consideration of risks, benefits and other options for treatment, the patient has consented to  Procedure(s): [place] (N/A) as a surgical intervention.  The patient's history has been reviewed, patient examined, no change in status, stable for surgery.  I have reviewed the patient's chart and labs.  Questions were answered to the patient's satisfaction.     [name]

## 2022-05-03 NOTE — Discharge Instructions (Signed)
HERNIA REPAIR: POST OP INSTRUCTIONS   EAT Gradually transition to a high fiber diet with a fiber supplement over the next few weeks after discharge.  Start with a pureed / full liquid diet (see below)  WALK Walk an hour a day (cumulative- not all at once).  Control your pain to do that.    CONTROL PAIN Control pain so that you can walk, sleep, tolerate sneezing/coughing, and go up/down stairs.  HAVE A BOWEL MOVEMENT DAILY Keep your bowels regular to avoid problems.  OK to try a laxative to override constipation.  OK to use an antidairrheal to slow down diarrhea.  Call if not better after 2 tries  CALL IF YOU HAVE PROBLEMS/CONCERNS Call if you are still struggling despite following these instructions. Call if you have concerns not answered by these instructions  ######################################################################    DIET: Follow a light bland diet & liquids the first 24 hours after arrival home, such as soup, liquids, starches, etc.  Be sure to drink plenty of fluids.  Quickly advance to a usual solid diet within a few days.  Avoid fast food or heavy meals as your are more likely to get nauseated or have irregular bowels.  A low-sugar, high-fiber diet for the rest of your life is ideal.   Take your usually prescribed home medications unless otherwise directed.  PAIN CONTROL: Pain is best controlled by a usual combination of three different methods TOGETHER: Ice/Heat Over the counter pain medication Prescription pain medication Most patients will experience some swelling and bruising around the hernia(s) such as the bellybutton, groins, or old incisions.  Ice packs or heating pads (30-60 minutes up to 6 times a day) will help. Use ice for the first few days to help decrease swelling and bruising, then switch to heat to help relax tight/sore spots and speed recovery.  Some people prefer to use ice alone, heat alone, alternating between ice & heat.  Experiment to what  works for you.  Swelling and bruising can take several weeks to resolve.   It is helpful to take an over-the-counter pain medication regularly for the first days: Naproxen (Aleve, etc)  Two 220mg tabs twice a day OR Ibuprofen (Advil, etc) Three 200mg tabs four times a day (every meal & bedtime) AND Acetaminophen (Tylenol, etc) 325-650mg four times a day (every meal & bedtime) A  prescription for pain medication should be given to you upon discharge.  Take your pain medication as prescribed, IF NEEDED.  If you are having problems/concerns with the prescription medicine (does not control pain, nausea, vomiting, rash, itching, etc), please call us [phone] to see if we need to switch you to a different pain medicine that will work better for you and/or control your side effect better. If you need a refill on your pain medication, please contact your pharmacy.  They will contact our office to request authorization. Prescriptions will not be filled after 5 pm or on week-ends.  Avoid getting constipated.  Between the surgery and the pain medications, it is common to experience some constipation.  Increasing fluid intake and taking a fiber supplement (such as Metamucil, Citrucel, FiberCon, MiraLax, etc) 1-2 times a day regularly will usually help prevent this problem from occurring.  A mild laxative (prune juice, Milk of Magnesia, MiraLax, etc) should be taken according to package directions if there are no bowel movements after 48 hours.    Wash / shower every day, starting 2 days after surgery.  You may shower over the   steri strips which are waterproof.  No rubbing, scrubbing, lotions or ointments to incision(s). Do not soak or submerge.   Remove your outer bandage 2 days after surgery. Steri strips (if present) will peel off after 1-2 weeks.  You may leave the incision open to air.  You may replace a dressing/Band-Aid to cover an incision for comfort if you wish.  Continue to shower over incision(s)  after the dressing is off.  Wear your abdominal binder at all times for the first 2 weeks after surgery.  Afterward, wear this when you are up and moving around until you are seen in the office for follow-up.  ACTIVITIES as tolerated:   You may resume regular (light) daily activities beginning the next day--such as daily self-care, walking, climbing stairs--gradually increasing activities as tolerated.  Control your pain so that you can walk an hour a day.  If you can walk 30 minutes without difficulty, it is safe to try more intense activity such as jogging, treadmill, bicycling, low-impact aerobics, swimming, etc. Refrain from the most intensive and strenuous activity such as sit-ups, heavy lifting, contact sports, etc  Refrain from any heavy lifting or straining until 6 weeks after surgery.   DO NOT PUSH THROUGH PAIN.  Let pain be your guide: If it hurts to do something, don't do it.  Pain is your body warning you to avoid that activity for another week until the pain goes down. You may drive when you are no longer taking prescription pain medication, you can comfortably wear a seatbelt, and you can safely maneuver your car and apply brakes. You may have sexual intercourse when it is comfortable.   FOLLOW UP in our office Please call [facility] at [phone] to set up an appointment to see your surgeon in the office for a follow-up appointment approximately 2-3 weeks after your surgery. Make sure that you call for this appointment the day you arrive home to insure a convenient appointment time.  9.  If you have disability of FMLA / Family leave forms, please bring the forms to the office for processing.  (do not give to your surgeon).  WHEN TO CALL us [phone]: Poor pain control Reactions / problems with new medications (rash/itching, nausea, etc)  Fever over 101.5 F (38.5 C) Inability to urinate Nausea and/or vomiting Worsening swelling or bruising Continued bleeding from  incision. Increased pain, redness, or drainage from the incision   The clinic staff is available to answer your questions during regular business hours (8:30am-5pm).  Please don't hesitate to call and ask to speak to one of our nurses for clinical concerns.   If you have a medical emergency, go to the nearest emergency room or call 911.  A surgeon from [facility] is always on call at the hospitals in [facility], [place], [place], [place], [place]  [id number] ?  [address] [address], [place],    [address] MAIN: [phone] ? TOLL FREE: [phone] ? FAX: [phone] [email or web address]

## 2022-05-03 NOTE — Anesthesia Postprocedure Evaluation (Signed)
Anesthesia Post Note  Patient: Chris Hamilton  Procedure(s) Performed: LAPAROSCOPIC INCISIONAL/VENTRAL HERNIA REPAIR     Patient location during evaluation: PACU Anesthesia Type: General Level of consciousness: awake and alert Pain management: pain level controlled Vital Signs Assessment: post-procedure vital signs reviewed and stable Respiratory status: spontaneous breathing, nonlabored ventilation, respiratory function stable and patient connected to nasal cannula oxygen Cardiovascular status: blood pressure returned to baseline and stable Postop Assessment: no apparent nausea or vomiting Anesthetic complications: yes   Encounter Notable Events  Notable Event Outcome Phase Comment  Difficult to intubate - expected  Intraprocedure Filed from anesthesia note documentation.    Last Vitals:  Vitals:   05/03/22 1000 05/03/22 1015  BP: (!) 144/96 (!) 129/94  Pulse: 78 69  Resp: 19 15  Temp:  36.8 C  SpO2: 98% 93%    Last Pain:  Vitals:   05/03/22 1000  TempSrc:   PainSc: 5                  Tyniah Kastens P Eulan Heyward

## 2022-05-03 NOTE — Anesthesia Procedure Notes (Signed)
Procedure Name: Intubation Date/Time: 05/03/2022 7:32 AM  Performed by: Lollie Sails, CRNAPre-anesthesia Checklist: Patient identified, Emergency Drugs available, Suction available, Patient being monitored and Timeout performed Patient Re-evaluated:Patient Re-evaluated prior to induction Oxygen Delivery Method: Circle system utilized Preoxygenation: Pre-oxygenation with 100% oxygen Induction Type: IV induction and Rapid sequence Laryngoscope Size: Miller and 3 Grade View: Grade III Tube type: Oral Tube size: 8.0 mm Number of attempts: 1 Airway Equipment and Method: Stylet Placement Confirmation: ETT inserted through vocal cords under direct vision, positive ETCO2 and breath sounds checked- equal and bilateral Secured at: 24 cm Tube secured with: Tape Dental Injury: Teeth and Oropharynx as per pre-operative assessment  Difficulty Due To: Difficulty was anticipated and Difficult Airway- due to anterior larynx

## 2022-05-04 ENCOUNTER — Encounter (HOSPITAL_COMMUNITY): Payer: Self-pay | Admitting: Surgery

## 2022-06-29 ENCOUNTER — Encounter: Payer: Self-pay | Admitting: Internal Medicine

## 2022-06-29 ENCOUNTER — Other Ambulatory Visit: Payer: Self-pay

## 2022-06-29 ENCOUNTER — Ambulatory Visit (INDEPENDENT_AMBULATORY_CARE_PROVIDER_SITE_OTHER): Payer: BC Managed Care – PPO | Admitting: Internal Medicine

## 2022-06-29 VITALS — BP 142/90 | HR 84 | Temp 98.4°F | Resp 16 | Ht 74.0 in | Wt 283.6 lb

## 2022-06-29 DIAGNOSIS — B182 Chronic viral hepatitis C: Secondary | ICD-10-CM | POA: Diagnosis not present

## 2022-06-29 NOTE — Progress Notes (Signed)
[facility]  Patient Active Problem List   Diagnosis Date Noted   Depression 12/23/2021    Priority: High   Chronic hepatitis C without hepatic coma (HCC) 12/22/2021    Priority: High   Status post umbilical hernia repair, follow-up exam 12/23/2021   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/15/2021   Obesity (BMI 30-39.9) 11/15/2021   Status post laparoscopic cholecystectomy 11/14/2021    Patient's Medications  New Prescriptions   No medications on file  Previous Medications   GLECAPREVIR-PIBRENTASVIR (MAVYRET) 100-40 MG TABS    Take 3 tablets by mouth daily with breakfast.   NONFORMULARY OR COMPOUNDED ITEM    Take 1 tablet by mouth daily in the afternoon. Tandrilax (Caffeine-Carisoprodol-Diclofenac)  Modified Medications   No medications on file  Discontinued Medications   No medications on file    Subjective: Chris Hamilton is in for his routine hepatitis C follow-up.  He completed 2 months of Mavyret therapy on 02/22/2022.  His last 2 hepatitis C viral loads were undetectable.  He underwent redo umbilical hernia repair on 05/03/2022.  He remains drug-free and sober.  He denies feeling depressed.  Review of Systems: Review of Systems  Psychiatric/Behavioral:  Negative for depression and substance abuse.     Past Medical History:  Diagnosis Date   Anxiety    Arthritis    Depression    Hepatitis C, chronic (HCC)    Incarcerated umbilical hernia    S/P repair by Chris Hamilton 09/22/2021   IV drug abuse [facility])    Shingles 2022    Social History   Tobacco Use   Smoking status: Every Day    Packs/day: 1.00    Years: 20.00    Total pack years: 20.00    Types: Cigarettes    Last attempt to quit: 2023    Years since quitting: 0.9   Smokeless tobacco: Never   Tobacco comments:    Vapes daily. Quit cigarettes  Vaping Use   Vaping Use: Every day   Substances: Nicotine, Flavoring  Substance Use Topics   Alcohol use: Not Currently   Drug  use: Not Currently    Types: Methamphetamines, Heroin, Cocaine, MDMA (Ecstacy), LSD, Marijuana, Amphetamines, Oxycodone, Benzodiazepines    Comment: past meth and heroin use    Family History  Problem Relation Age of Onset   Cancer Mother    Miscarriages / Stillbirths Mother    Cancer Father    Diabetes Maternal Grandmother    Cancer Maternal Grandmother    Heart disease Neg Hx     No Known Allergies  Objective: Vitals:   06/29/22 1126  BP: (!) 142/90  Pulse: 84  Resp: 16  Temp: 98.4 F (36.9 C)  TempSrc: Oral  SpO2: 96%  Weight: 283 lb 9.6 oz (128.6 kg)  Height: 6\' 2"  (1.88 m)   Body mass index is 36.41 kg/m.  Physical Exam Constitutional:      Comments: He is pleasant and very matter-of-fact as usual.  Cardiovascular:     Rate and Rhythm: Normal rate.  Pulmonary:     Effort: Pulmonary effort is normal.  Psychiatric:        Mood and Affect: Mood normal.     Lab Results Hepatitis C viral loads 11/14/2021 10,500 02/22/2022 less than 15 03/30/2022 less than 15   Problem List Items Addressed This Visit       High   Chronic hepatitis C without hepatic coma (HCC) - Primary  His hepatitis C virus suppressed completely on therapy with Mavyret.  He is here today for test of cure.  I will arrange phone follow-up in 1 week to review his repeat hepatitis C viral load results.      Relevant Orders   Hepatitis C RNA quantitative     Chris Asters, MD [facility] [phone] pager   [phone] cell 06/29/2022, 11:36 AM

## 2022-06-29 NOTE — Assessment & Plan Note (Signed)
His hepatitis C virus suppressed completely on therapy with Mavyret.  He is here today for test of cure.  I will arrange phone follow-up in 1 week to review his repeat hepatitis C viral load results.

## 2022-07-01 LAB — HEPATITIS C RNA QUANTITATIVE
HCV Quantitative Log: <1.18 log IU/mL
HCV RNA, PCR, QN: <15 IU/mL

## 2022-07-06 ENCOUNTER — Encounter: Payer: Self-pay | Admitting: Internal Medicine

## 2022-07-06 ENCOUNTER — Other Ambulatory Visit: Payer: Self-pay

## 2022-07-06 ENCOUNTER — Ambulatory Visit (INDEPENDENT_AMBULATORY_CARE_PROVIDER_SITE_OTHER): Payer: BC Managed Care – PPO | Admitting: Internal Medicine

## 2022-07-06 DIAGNOSIS — B182 Chronic viral hepatitis C: Secondary | ICD-10-CM

## 2022-07-06 NOTE — Progress Notes (Signed)
Virtual Visit via Video Note  I connected with [name] on 07/06/22 at 10:30 AM EST by a video enabled telemedicine application and verified that I am speaking with the correct person using two identifiers.  Location: Patient: Home Provider: [facility]   I discussed the limitations of evaluation and management by telemedicine and the availability of in person appointments. The patient expressed understanding and agreed to proceed.  History of Present Illness: I called and spoke with Chris Hamilton today.  He completed 2 months of Mavyret on 03/30/2022 for his chronic hepatitis C.  His hepatitis C viral load quickly became undetectable on therapy.  He was in last week for test of cure and that viral load was also less than 15.   Observations/Objective: Hepatitis C viral load 11/14/2021 10,500 02/22/2022 less than 15 03/30/2022 less than 15 06/29/2022 less than 15   Assessment and Plan: His hepatitis C has been cured.  Follow Up Instructions: Follow-up here as needed   I discussed the assessment and treatment plan with the patient. The patient was provided an opportunity to ask questions and all were answered. The patient agreed with the plan and demonstrated an understanding of the instructions.   The patient was advised to call back or seek an in-person evaluation if the symptoms worsen or if the condition fails to improve as anticipated.  I provided 15 minutes of non-face-to-face time during this encounter.   Cliffton Asters, MD

## 2022-08-30 ENCOUNTER — Ambulatory Visit (INDEPENDENT_AMBULATORY_CARE_PROVIDER_SITE_OTHER): Payer: BC Managed Care – PPO | Admitting: Family

## 2022-08-30 ENCOUNTER — Encounter: Payer: Self-pay | Admitting: Family

## 2022-08-30 VITALS — BP 116/70 | HR 72 | Temp 98.3°F | Resp 18 | Ht 74.0 in | Wt 283.6 lb

## 2022-08-30 DIAGNOSIS — M25562 Pain in left knee: Secondary | ICD-10-CM

## 2022-08-30 DIAGNOSIS — J019 Acute sinusitis, unspecified: Secondary | ICD-10-CM | POA: Diagnosis not present

## 2022-08-30 DIAGNOSIS — G8929 Other chronic pain: Secondary | ICD-10-CM

## 2022-08-30 MED ORDER — FLUTICASONE PROPIONATE 50 MCG/ACT NA SUSP: 2.0000 | Freq: Every day | NASAL | 6 refills | Status: DC

## 2022-08-30 MED ORDER — ALBUTEROL SULFATE HFA 108 (90 BASE) MCG/ACT IN AERS: 2.0000 | INHALATION_SPRAY | Freq: Four times a day (QID) | RESPIRATORY_TRACT | 2 refills | Status: AC | PRN

## 2022-08-30 MED ORDER — DICLOFENAC SODIUM 75 MG PO TBEC: 75.0000 mg | DELAYED_RELEASE_TABLET | Freq: Two times a day (BID) | ORAL | 0 refills | Status: DC

## 2022-08-30 MED ORDER — AMOXICILLIN-POT CLAVULANATE 875-125 MG PO TABS: 1.0000 | ORAL_TABLET | Freq: Two times a day (BID) | ORAL | 0 refills | Status: AC

## 2022-08-30 NOTE — Progress Notes (Signed)
Chris Hamilton is a 47 y.o. male with the following history as recorded in Epic[employer]:  Patient Active Problem List   Diagnosis Date Noted   Depression 12/23/2021   Status post umbilical hernia repair, follow-up exam 12/23/2021   Chronic hepatitis C without hepatic coma ([place]) 12/22/2021   History of drug abuse in remission ([place]) 11/15/2021   Obesity (BMI 30-39.9) 11/15/2021   Status post laparoscopic cholecystectomy 11/14/2021    Current Outpatient Medications  Medication Sig Dispense Refill   albuterol (VENTOLIN HFA) 108 (90 Base) MCG/ACT inhaler Inhale 2 puffs into the lungs every 6 (six) hours as needed for wheezing or shortness of breath. 8 g 2   amoxicillin-clavulanate (AUGMENTIN) 875-125 MG tablet Take 1 tablet by mouth 2 (two) times daily for 10 days. 20 tablet 0   diclofenac (VOLTAREN) 75 MG EC tablet Take 1 tablet (75 mg total) by mouth 2 (two) times daily. 30 tablet 0   fluticasone (FLONASE) 50 MCG/ACT nasal spray Place 2 sprays into both nostrils daily. 16 g 6   NONFORMULARY OR COMPOUNDED ITEM Take 1 tablet by mouth daily in the afternoon. Tandrilax (Caffeine-Carisoprodol-Diclofenac)     No current facility-administered medications for this visit.    Allergies: Patient has no known allergies.  Past Medical History:  Diagnosis Date   Anxiety    Arthritis    Depression    Hepatitis C, chronic ([place])    Incarcerated umbilical hernia    S/P repair by Dr. Kae Heller 09/22/2021   IV drug abuse ([place])    Shingles 2022    Past Surgical History:  Procedure Laterality Date   CHOLECYSTECTOMY N/A 11/16/2021   Procedure: LAPAROSCOPIC CHOLECYSTECTOMY;  Surgeon: Armandina Gemma, MD;  Location: [facility];  Service: General;  Laterality: N/A;   [place] N/A 05/03/2022   Procedure: LAPAROSCOPIC INCISIONAL/VENTRAL HERNIA REPAIR;  Surgeon: Clovis Riley, MD;  Location: [facility];  Service: General;  Laterality: N/A;   UMBILICAL HERNIA REPAIR N/A 09/22/2021   Procedure: OPEN  UMBILICAL HERNIA REPAIR WITH MESH;  Surgeon: Clovis Riley, MD;  Location: [facility];  Service: General;  Laterality: N/A;    Family History  Problem Relation Age of Onset   Cancer Mother    Miscarriages / Stillbirths Mother    Cancer Father    Diabetes Maternal Grandmother    Cancer Maternal Grandmother    Heart disease Neg Hx     Social History   Tobacco Use   Smoking status: Every Day    Packs/day: 1.00    Years: 20.00    Total pack years: 20.00    Types: Cigarettes    Last attempt to quit: 2023    Years since quitting: 1.0   Smokeless tobacco: Never   Tobacco comments:    Vapes daily. Quit cigarettes  Substance Use Topics   Alcohol use: Not Currently    Subjective:  Sore throat x 1 week; "feels raw, irritated;" no fever, shortness of breath; no wheezing;   Also requesting short term refill on Diclofenac; knows that needs to consider knee replacement; overdue to see his orthopedist;    Objective:  Vitals:   08/30/22 1319  BP: 116/70  Pulse: 72  Resp: 18  Temp: 98.3 F (36.8 C)  TempSrc: Oral  SpO2: 98%  Weight: 283 lb 9.6 oz (128.6 kg)  Height: 6\' 2"  (1.88 m)    General: Well developed, well nourished, in no acute distress  Skin : Warm and dry.  Head: Normocephalic and atraumatic  Eyes: Sclera and conjunctiva clear; pupils round and reactive to light; extraocular movements intact  Ears: External normal; canals clear; tympanic membranes normal  Oropharynx: Pink, supple. No suspicious lesions  Neck: Supple without thyromegaly, adenopathy  Lungs: Respirations unlabored; clear to auscultation bilaterally without wheeze, rales, rhonchi  CVS exam: normal rate and regular rhythm.  Neurologic: Alert and oriented; speech intact; face symmetrical; moves all extremities well; CNII-XII intact without focal deficit   Assessment:  1. Acute sinusitis, recurrence not specified, unspecified location   2. Chronic pain of left knee     Plan:  Rx for  Augmentin 875 mg bid x 10 days, Flonase, albuterol; increase fluids, rest; discussed that vaping increases risk of infection; Short term refill on Diclofenac- he is encouraged to follow up with his orthopedist to discuss long term management;   No follow-ups on file.  No orders of the defined types were placed in this encounter.   Requested Prescriptions   Signed Prescriptions Disp Refills   amoxicillin-clavulanate (AUGMENTIN) 875-125 MG tablet 20 tablet 0    Sig: Take 1 tablet by mouth 2 (two) times daily for 10 days.   fluticasone (FLONASE) 50 MCG/ACT nasal spray 16 g 6    Sig: Place 2 sprays into both nostrils daily.   albuterol (VENTOLIN HFA) 108 (90 Base) MCG/ACT inhaler 8 g 2    Sig: Inhale 2 puffs into the lungs every 6 (six) hours as needed for wheezing or shortness of breath.   diclofenac (VOLTAREN) 75 MG EC tablet 30 tablet 0    Sig: Take 1 tablet (75 mg total) by mouth 2 (two) times daily.

## 2022-09-21 ENCOUNTER — Ambulatory Visit (HOSPITAL_BASED_OUTPATIENT_CLINIC_OR_DEPARTMENT_OTHER): Payer: BC Managed Care – PPO | Admitting: Orthopaedic Surgery

## 2022-09-21 ENCOUNTER — Ambulatory Visit (INDEPENDENT_AMBULATORY_CARE_PROVIDER_SITE_OTHER): Payer: BC Managed Care – PPO

## 2022-09-21 DIAGNOSIS — M79661 Pain in right lower leg: Secondary | ICD-10-CM

## 2022-09-21 DIAGNOSIS — M1711 Unilateral primary osteoarthritis, right knee: Secondary | ICD-10-CM | POA: Diagnosis not present

## 2022-09-21 DIAGNOSIS — M25561 Pain in right knee: Secondary | ICD-10-CM

## 2022-09-21 DIAGNOSIS — M1712 Unilateral primary osteoarthritis, left knee: Secondary | ICD-10-CM

## 2022-09-21 MED ORDER — TRIAMCINOLONE ACETONIDE 40 MG/ML IJ SUSP
80.0000 mg | INTRAMUSCULAR | Status: AC | PRN
  Administered 2022-09-21: 80 mg via INTRA_ARTICULAR

## 2022-09-21 MED ORDER — LIDOCAINE HCL 1 % IJ SOLN
4.0000 mL | INTRAMUSCULAR | Status: AC | PRN
  Administered 2022-09-21: 4 mL

## 2022-09-21 NOTE — Progress Notes (Signed)
Chief Complaint: Left knee pain, right knee pain     History of Present Illness:   09/21/2022: Presents today for follow-up of bilateral knees.  At this time he was deferring left knee injection.  His right knee is also remained quite symptomatic and painful although his left is generally worse.  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  Chris Hamilton is a 47 y.o. male presents today with ongoing left knee pain.  He states that when he was 16 he was diagnosed with a meniscus tear and subsequently surgery was deferred and he did come with respiratory illness.  After that time he began experiencing some pain relief and as result the knee was not further addressed.  He does state that the knee is continue to feel unstable and painful.  He will occasionally feel pops in the knee.  He does have mechanical symptoms involving the knee.  He has not had any physical therapy.  He states that the knee continues to give out on him.  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  He is here today for further assessment.  He is also complaining of bilateral heel pain.  This is occurring on the medial aspect of the arch.  This has been going on for several months now.  He has used a frozen water bottle about of the heel to help.    Surgical History:   None  PMH/PSH/Family History/Social History/Meds/Allergies:    Past Medical History:  Diagnosis Date  . Anxiety   . Arthritis   . Depression   . Hepatitis C, chronic ([place])   . Incarcerated umbilical hernia    S/P repair by Dr. Kae Heller 09/22/2021  . IV drug abuse ([place])   . Shingles 2022   Past Surgical History:  Procedure Laterality Date  . CHOLECYSTECTOMY N/A 11/16/2021   Procedure: LAPAROSCOPIC CHOLECYSTECTOMY;  Surgeon: Armandina Gemma, MD;  Location: [facility];  Service: General;  Laterality: N/A;  . INCISIONAL HERNIA REPAIR N/A  05/03/2022   Procedure: LAPAROSCOPIC INCISIONAL/VENTRAL HERNIA REPAIR;  Surgeon: Clovis Riley, MD;  Location: [facility];  Service: General;  Laterality: N/A;  . UMBILICAL HERNIA REPAIR N/A 09/22/2021   Procedure: OPEN UMBILICAL HERNIA REPAIR WITH MESH;  Surgeon: Clovis Riley, MD;  Location: [facility];  Service: General;  Laterality: N/A;   Social History   Socioeconomic History  . Marital status: Single    Spouse name: Not on file  . Number of children: Not on file  . Years of education: Not on file  . Highest education level: Not on file  Occupational History  . Not on file  Tobacco Use  . Smoking status: Every Day    Packs/day: 1.00    Years: 20.00    Total pack years: 20.00    Types: Cigarettes    Last attempt to quit: 2023    Years since quitting: 1.1  . Smokeless tobacco: Never  . Tobacco comments:    Vapes daily. Quit cigarettes  Vaping Use  . Vaping Use: Every day  . Substances: Nicotine, Flavoring  Substance and Sexual Activity  . Alcohol use: Not Currently  . Drug use: Not Currently    Types: Methamphetamines, Heroin, Cocaine, MDMA (Ecstacy),  LSD, Marijuana, Amphetamines, Oxycodone, Benzodiazepines    Comment: past meth and heroin use  . Sexual activity: Yes  Other Topics Concern  . Not on file  Social History Narrative  . Not on file   Social Determinants of Health   Financial Resource Strain: Not on file  Food Insecurity: Not on file  Transportation Needs: Not on file  Physical Activity: Not on file  Stress: Not on file  Social Connections: Not on file   Family History  Problem Relation Age of Onset  . Cancer Mother   . Miscarriages / [place] Mother   . Cancer Father   . Diabetes Maternal Grandmother   . Cancer Maternal Grandmother   . Heart disease Neg Hx    No Known Allergies Current Outpatient Medications  Medication Sig Dispense Refill  . albuterol (VENTOLIN HFA) 108 (90 Base) MCG/ACT inhaler Inhale 2 puffs into the  lungs every 6 (six) hours as needed for wheezing or shortness of breath. 8 g 2  . diclofenac (VOLTAREN) 75 MG EC tablet Take 1 tablet (75 mg total) by mouth 2 (two) times daily. 30 tablet 0  . fluticasone (FLONASE) 50 MCG/ACT nasal spray Place 2 sprays into both nostrils daily. 16 g 6  . NONFORMULARY OR COMPOUNDED ITEM Take 1 tablet by mouth daily in the afternoon. Tandrilax (Caffeine-Carisoprodol-Diclofenac)     No current facility-administered medications for this visit.   No results found.  Review of Systems:   A ROS was performed including pertinent positives and negatives as documented in the HPI.  Physical Exam :   Constitutional: NAD and appears stated age Neurological: Alert and oriented Psych: Appropriate affect and cooperative There were no vitals taken for this visit.   Comprehensive Musculoskeletal Exam:      Musculoskeletal Exam  Gait Normal  Alignment Normal   Right Left  Inspection Normal Normal  Palpation    Tenderness Medial joint Medial joint  Crepitus None None  Effusion None None  Range of Motion    Extension 0 0  Flexion 135 135  Strength    Extension 5/5 5/5  Flexion 5/5 5/5  Ligament Exam     Generalized Laxity No No  Lachman Negative Negative   Pivot Shift Negative Negative  Anterior Drawer Negative Negative  Valgus at 0 Negative Negative  Valgus at 20 Negative Negative  Varus at 0 0 0  Varus at 20   0 0  Posterior Drawer at 90 0 0  Vascular/Lymphatic Exam    Edema None None  Venous Stasis Changes No No  Distal Circulation Normal Normal  Neurologic    Light Touch Sensation Intact Intact  Special Tests:      Imaging:   Xray (4 views left knee, 4 views right knee): Normal  MRI left knee: He has significant medial meniscal tearing and extrusion with end-stage arthritis of the medial joint space.  There is evidence of an old PCL injury as well  I personally reviewed and interpreted the radiographs.   Assessment:   47 y.o. 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  I did describe that unfortunately his left worse than right these do have evidence of early arthritis.  This time he would like to proceed with ultrasound-guided injections of both knees for hopefully longer lasting relief.  Will plan to proceed with this.  I will plan to see him back as needed Plan :    -Bilateral ultrasound-guided knee injections  performed after verbal consent obtained    Procedure Note  Patient: Chris Hamilton             Date of Birth: 09-06-1975           MRN: [id number]             Visit Date: 09/21/2022  Procedures: Visit Diagnoses:  1. Pain of right knee and lower leg     Large Joint Inj: R knee on 09/21/2022 9:46 AM Indications: pain Details: 22 G 1.5 in needle, ultrasound-guided anterior approach  Arthrogram: No  Medications: 4 mL lidocaine 1 %; 80 mg triamcinolone acetonide 40 MG/ML Outcome: tolerated well, no immediate complications Procedure, treatment alternatives, risks and benefits explained, specific risks discussed. Consent was given by the patient. Immediately prior to procedure a time out was called to verify the correct patient, procedure, equipment, support staff and site/side marked as required. Patient was prepped and draped in the usual sterile fashion.    Large Joint Inj: L knee on 09/21/2022 9:46 AM Indications: pain Details: 22 G 1.5 in needle, ultrasound-guided anterior approach  Arthrogram: No  Medications: 4 mL lidocaine 1 %; 80 mg triamcinolone acetonide 40 MG/ML Outcome: tolerated well, no immediate complications Procedure, treatment alternatives, risks and benefits explained, specific risks discussed. Consent was given by the patient. Immediately prior to procedure a time out was called to verify the correct patient, procedure, equipment, support staff and site/side marked as required. Patient was prepped and draped in the  usual sterile fashion.          I personally saw and evaluated the patient, and participated in the management and treatment plan.  Vanetta Mulders, MD Attending Physician, Orthopedic Surgery  This document was dictated using Dragon voice recognition software. A reasonable attempt at proof reading has been made to minimize errors.

## 2023-01-12 ENCOUNTER — Other Ambulatory Visit: Payer: Self-pay | Admitting: Surgery

## 2023-01-12 DIAGNOSIS — K432 Incisional hernia without obstruction or gangrene: Secondary | ICD-10-CM

## 2023-01-25 ENCOUNTER — Ambulatory Visit
Admission: RE | Admit: 2023-01-25 | Discharge: 2023-01-25 | Disposition: A | Discharge status: Home / Self Care | Payer: BC Managed Care – PPO | Source: Ambulatory Visit | Attending: Surgery | Admitting: Surgery

## 2023-01-25 DIAGNOSIS — K432 Incisional hernia without obstruction or gangrene: Secondary | ICD-10-CM

## 2023-01-25 MED ORDER — IOPAMIDOL (ISOVUE-300) INJECTION 61%
100.0000 mL | Freq: Once | INTRAVENOUS | Status: AC | PRN
  Administered 2023-01-25: 100 mL via INTRAVENOUS

## 2023-02-15 NOTE — Progress Notes (Signed)
Sent message, via epic in basket, requesting orders in epic from surgeon.  

## 2023-02-21 NOTE — Progress Notes (Signed)
COVID Vaccine Completed: yes  Date of COVID positive in last 90 days:  PCP - Ria Clock, FNP Cardiologist -   Chest x-ray -  EKG -  Stress Test -  ECHO -  Cardiac Cath -  Pacemaker/ICD device last checked: Spinal Cord Stimulator:  Bowel Prep -   Sleep Study -  CPAP -   Fasting Blood Sugar -  Checks Blood Sugar _____ times a day  Last dose of GLP1 agonist-  N/A GLP1 instructions:  N/A   Last dose of SGLT-2 inhibitors-  N/A SGLT-2 instructions: N/A   Blood Thinner Instructions:  Time Aspirin Instructions: Last Dose:  Activity level:  Can go up a flight of stairs and perform activities of daily living without stopping and without symptoms of chest pain or shortness of breath.  Able to exercise without symptoms  Unable to go up a flight of stairs without symptoms of     Anesthesia review:   Patient denies shortness of breath, fever, cough and chest pain at PAT appointment  Patient verbalized understanding of instructions that were given to them at the PAT appointment. Patient was also instructed that they will need to review over the PAT instructions again at home before surgery.

## 2023-02-21 NOTE — Patient Instructions (Signed)
SURGICAL WAITING ROOM VISITATION  Patients having surgery or a procedure may have no more than 2 support people in the waiting area - these visitors may rotate.    Children under the age of 45 must have an adult with them who is not the patient.  Due to an increase in RSV and influenza rates and associated hospitalizations, children ages 47 and under may not visit patients in [facility] hospitals.  If the patient needs to stay at the hospital during part of their recovery, the visitor guidelines for inpatient rooms apply. Pre-op nurse will coordinate an appropriate time for 1 support person to accompany patient in pre-op.  This support person may not rotate.    Please refer to the [facility] website for the visitor guidelines for Inpatients (after your surgery is over and you are in a regular room).    Your procedure is scheduled on: 03/08/23   Report to [facility] Main Entrance    Report to admitting at 8:15 AM   Call this number if you have problems the morning of surgery [phone]   Do not eat food or drink liquids :After Midnight.    Oral Hygiene is also important to reduce your risk of infection.                                    Remember - BRUSH YOUR TEETH THE MORNING OF SURGERY WITH YOUR REGULAR TOOTHPASTE  DENTURES WILL BE REMOVED PRIOR TO SURGERY PLEASE DO NOT APPLY "Poly grip" OR ADHESIVES!!!   Do NOT smoke after Midnight   Stop all vitamins and herbal supplements 7 days before surgery.   Take these medicines the morning of surgery with A SIP OF WATER: albuterol                                You may not have any metal on your body including jewelry, and body piercing             Do not wear lotions, powders, cologne, or deodorant              Men may shave face and neck.   Do not bring valuables to the hospital. [facility] IS NOT             RESPONSIBLE   FOR VALUABLES.   Contacts, glasses, dentures or bridgework may not be worn into  surgery.   Bring small overnight bag day of surgery.   DO NOT BRING YOUR HOME MEDICATIONS TO THE HOSPITAL. PHARMACY WILL DISPENSE MEDICATIONS LISTED ON YOUR MEDICATION LIST TO YOU DURING YOUR ADMISSION IN THE HOSPITAL!   Special Instructions: Bring a copy of your healthcare power of attorney and living will documents the day of surgery if you haven't scanned them before.              Please read over the following fact sheets you were given: IF YOU HAVE QUESTIONS ABOUT YOUR PRE-OP INSTRUCTIONS PLEASE CALL [phone] Hamilton    If you received a COVID test during your pre-op visit  it is requested that you wear a mask when out in public, stay away from anyone that may not be feeling well and notify your surgeon if you develop symptoms. If you test positive for Covid or have been in contact with anyone that has tested positive in the  last 10 days please notify you surgeon.    [facility] - Preparing for Surgery Before surgery, you can play an important role.  Because skin is not sterile, your skin needs to be as free of germs as possible.  You can reduce the number of germs on your skin by washing with CHG (chlorahexidine gluconate) soap before surgery.  CHG is an antiseptic cleaner which kills germs and bonds with the skin to continue killing germs even after washing. Please DO NOT use if you have an allergy to CHG or antibacterial soaps.  If your skin becomes reddened/irritated stop using the CHG and inform your nurse when you arrive at Short Stay. Do not shave (including legs and underarms) for at least 48 hours prior to the first CHG shower.  You may shave your face/neck.  Please follow these instructions carefully:  1.  Shower with CHG Soap the night before surgery and the  morning of surgery.  2.  If you choose to wash your hair, wash your hair first as usual with your normal  shampoo.  3.  After you shampoo, rinse your hair and body thoroughly to remove the shampoo.                              4.  Use CHG as you would any other liquid soap.  You can apply chg directly to the skin and wash.  Gently with a scrungie or clean washcloth.  5.  Apply the CHG Soap to your body ONLY FROM THE NECK DOWN.   Do   not use on face/ open                           Wound or open sores. Avoid contact with eyes, ears mouth and   genitals (private parts).                       Wash face,  Genitals (private parts) with your normal soap.             6.  Wash thoroughly, paying special attention to the area where your    surgery  will be performed.  7.  Thoroughly rinse your body with warm water from the neck down.  8.  DO NOT shower/wash with your normal soap after using and rinsing off the CHG Soap.                9.  Pat yourself dry with a clean towel.            10.  Wear clean pajamas.            11.  Place clean sheets on your bed the night of your first shower and do not  sleep with pets. Day of Surgery : Do not apply any lotions/deodorants the morning of surgery.  Please wear clean clothes to the hospital/surgery center.  FAILURE TO FOLLOW THESE INSTRUCTIONS MAY RESULT IN THE CANCELLATION OF YOUR SURGERY  PATIENT SIGNATURE_________________________________  NURSE SIGNATURE__________________________________  ________________________________________________________________________

## 2023-02-21 NOTE — Progress Notes (Signed)
Please place orders for PAT appointment scheduled 02/22/23.

## 2023-02-22 ENCOUNTER — Encounter (HOSPITAL_COMMUNITY)
Admission: RE | Admit: 2023-02-22 | Discharge: 2023-02-22 | Disposition: A | Discharge status: Home / Self Care | Payer: BC Managed Care – PPO | Source: Ambulatory Visit | Attending: Surgery | Admitting: Surgery

## 2023-02-22 ENCOUNTER — Other Ambulatory Visit: Payer: Self-pay

## 2023-02-22 VITALS — BP 139/104 | HR 83 | Temp 98.4°F | Resp 16 | Ht 74.0 in | Wt 284.0 lb

## 2023-02-22 DIAGNOSIS — F1911 Other psychoactive substance abuse, in remission: Secondary | ICD-10-CM | POA: Insufficient documentation

## 2023-02-22 DIAGNOSIS — Z01818 Encounter for other preprocedural examination: Secondary | ICD-10-CM

## 2023-02-22 DIAGNOSIS — B182 Chronic viral hepatitis C: Secondary | ICD-10-CM

## 2023-02-22 DIAGNOSIS — Z01812 Encounter for preprocedural laboratory examination: Secondary | ICD-10-CM | POA: Insufficient documentation

## 2023-02-22 LAB — CBC
HCT: 39.5 % (ref 39.0–52.0)
Hemoglobin: 13 g/dL (ref 13.0–17.0)
MCH: 28.3 pg (ref 26.0–34.0)
MCHC: 32.9 g/dL (ref 30.0–36.0)
MCV: 86.1 fL (ref 80.0–100.0)
Platelets: 175 10*3/uL (ref 150–400)
RBC: 4.59 MIL/uL (ref 4.22–5.81)
RDW: 12.5 % (ref 11.5–15.5)
WBC: 7.5 10*3/uL (ref 4.0–10.5)
nRBC: 0 % (ref 0.0–0.2)

## 2023-02-22 LAB — COMPREHENSIVE METABOLIC PANEL
ALT: 26 U/L (ref 0–44)
AST: 18 U/L (ref 15–41)
Albumin: 4.1 g/dL (ref 3.5–5.0)
Alkaline Phosphatase: 77 U/L (ref 38–126)
Anion gap: 9 (ref 5–15)
BUN: 16 mg/dL (ref 6–20)
CO2: 22 mmol/L (ref 22–32)
Calcium: 9.1 mg/dL (ref 8.9–10.3)
Chloride: 105 mmol/L (ref 98–111)
Creatinine, Ser: 0.65 mg/dL (ref 0.61–1.24)
GFR, Estimated: >60 mL/min (ref >60)
Glucose, Bld: 98 mg/dL (ref 70–99)
Potassium: 3.9 mmol/L (ref 3.5–5.1)
Sodium: 136 mmol/L (ref 135–145)
Total Bilirubin: 0.5 mg/dL (ref 0.3–1.2)
Total Protein: 7.5 g/dL (ref 6.5–8.1)

## 2023-02-22 NOTE — Progress Notes (Addendum)
COVID Vaccine Completed: yes  Date of COVID positive in last 68 days:no  PCP - Ria Clock, FNP Cardiologist - no no Chest x-ray -  EKG -  Stress Test -  ECHO -  Cardiac Cath - no Pacemaker/ICD device last checked:N/A Spinal Cord Stimulator:N/A  Bowel Prep - no  Sleep Study - no CPAP -   Fasting Blood Sugar - no Checks Blood Sugar _____ times a day  Last dose of GLP1 agonist-  N/A GLP1 instructions:  N/A   Last dose of SGLT-2 inhibitors-  N/A SGLT-2 instructions: N/A   Blood Thinner Instructions:  no Aspirin Instructions:no Last Dose:no  Activity level:  Can go up a flight of stairs and perform activities of daily living without stopping and without symptoms of chest pain or shortness of breath.  Able to exercise without symptoms   Anesthesia review:   Patient denies shortness of breath, fever, cough and chest pain at PAT appointment  Patient verbalized understanding of instructions that were given to them at the PAT appointment. Patient was also instructed that they will need to review over the PAT instructions again at home before surgery.

## 2023-03-07 NOTE — Anesthesia Preprocedure Evaluation (Signed)
Anesthesia Evaluation  Patient identified by MRN, date of birth, ID band Patient awake    Reviewed: Allergy & Precautions, NPO status , Patient's Chart, lab work & pertinent test results  Airway Mallampati: II  TM Distance: >3 FB Neck ROM: Full    Dental no notable dental hx. (+) Teeth Intact, Dental Advisory Given   Pulmonary neg pulmonary ROS, Current SmokerPatient did not abstain from smoking.   Pulmonary exam normal breath sounds clear to auscultation       Cardiovascular negative cardio ROS Normal cardiovascular exam Rhythm:Regular Rate:Normal     Neuro/Psych  PSYCHIATRIC DISORDERS Anxiety Depression       GI/Hepatic ,,,(+) Hepatitis -, C  Endo/Other  negative endocrine ROS    Renal/GU negative Renal ROS     Musculoskeletal  (+) Arthritis ,    Abdominal   Peds negative pediatric ROS (+)  Hematology negative hematology ROS (+)   Anesthesia Other Findings   Reproductive/Obstetrics                             Anesthesia Physical Anesthesia Plan  ASA: 3  Anesthesia Plan: General   Post-op Pain Management: Ketamine IV* and Toradol IV (intra-op)*   Induction: Intravenous  PONV Risk Score and Plan: 3 and Midazolam, Ondansetron, Treatment may vary due to age or medical condition and Dexamethasone  Airway Management Planned: Oral ETT  Additional Equipment: None  Intra-op Plan:   Post-operative Plan: Extubation in OR  Informed Consent: I have reviewed the patients History and Physical, chart, labs and discussed the procedure including the risks, benefits and alternatives for the proposed anesthesia with the patient or authorized representative who has indicated his/her understanding and acceptance.     Dental advisory given  Plan Discussed with: CRNA and Anesthesiologist  Anesthesia Plan Comments:        Anesthesia Quick Evaluation

## 2023-03-08 ENCOUNTER — Encounter (HOSPITAL_COMMUNITY): Payer: Self-pay | Admitting: Surgery

## 2023-03-08 ENCOUNTER — Other Ambulatory Visit: Payer: Self-pay

## 2023-03-08 ENCOUNTER — Encounter (HOSPITAL_COMMUNITY)
Admission: AD | Disposition: A | Discharge status: Home / Self Care | Payer: Self-pay | Source: Ambulatory Visit | Attending: Surgery

## 2023-03-08 ENCOUNTER — Ambulatory Visit (HOSPITAL_COMMUNITY)
Admission: AD | Admit: 2023-03-08 | Discharge: 2023-03-09 | Disposition: A | Discharge status: Home / Self Care | Payer: BC Managed Care – PPO | Source: Ambulatory Visit | Attending: Surgery | Admitting: Surgery

## 2023-03-08 ENCOUNTER — Ambulatory Visit (HOSPITAL_COMMUNITY): Payer: BC Managed Care – PPO | Admitting: Anesthesiology

## 2023-03-08 ENCOUNTER — Ambulatory Visit (HOSPITAL_COMMUNITY): Payer: Self-pay | Admitting: Anesthesiology

## 2023-03-08 DIAGNOSIS — F17210 Nicotine dependence, cigarettes, uncomplicated: Secondary | ICD-10-CM | POA: Diagnosis not present

## 2023-03-08 DIAGNOSIS — Z9049 Acquired absence of other specified parts of digestive tract: Secondary | ICD-10-CM | POA: Insufficient documentation

## 2023-03-08 DIAGNOSIS — B1920 Unspecified viral hepatitis C without hepatic coma: Secondary | ICD-10-CM | POA: Insufficient documentation

## 2023-03-08 DIAGNOSIS — K432 Incisional hernia without obstruction or gangrene: Principal | ICD-10-CM | POA: Insufficient documentation

## 2023-03-08 HISTORY — PX: XI ROBOTIC ASSISTED VENTRAL HERNIA: SHX6789

## 2023-03-08 LAB — CBC
HCT: 39.3 % (ref 39.0–52.0)
Hemoglobin: 12.4 g/dL — ABNORMAL LOW (ref 13.0–17.0)
MCH: 28.1 pg (ref 26.0–34.0)
MCHC: 31.6 g/dL (ref 30.0–36.0)
MCV: 89.1 fL (ref 80.0–100.0)
Platelets: 184 10*3/uL (ref 150–400)
RBC: 4.41 MIL/uL (ref 4.22–5.81)
RDW: 12.9 % (ref 11.5–15.5)
WBC: 20.8 10*3/uL — ABNORMAL HIGH (ref 4.0–10.5)
nRBC: 0 % (ref 0.0–0.2)

## 2023-03-08 LAB — CREATININE, SERUM
Creatinine, Ser: 0.77 mg/dL (ref 0.61–1.24)
GFR, Estimated: >60 mL/min (ref >60)

## 2023-03-08 SURGERY — REPAIR, HERNIA, VENTRAL, ROBOT-ASSISTED
Anesthesia: General

## 2023-03-08 MED ORDER — HYDROMORPHONE HCL 1 MG/ML IJ SOLN: 1.0000 mg | INTRAMUSCULAR | Status: DC | PRN

## 2023-03-08 MED ORDER — METHOCARBAMOL 1000 MG/10ML IJ SOLN: 500.0000 mg | Freq: Four times a day (QID) | INTRAVENOUS | Status: DC | PRN

## 2023-03-08 MED ORDER — LIDOCAINE HCL (PF) 2 % IJ SOLN
INTRAMUSCULAR | Status: AC
  Filled 2023-03-08: qty 5

## 2023-03-08 MED ORDER — BUPIVACAINE-EPINEPHRINE 0.25% -1:200000 IJ SOLN
INTRAMUSCULAR | Status: AC
  Filled 2023-03-08: qty 1

## 2023-03-08 MED ORDER — ACETAMINOPHEN 325 MG PO TABS
650.0000 mg | ORAL_TABLET | Freq: Four times a day (QID) | ORAL | Status: DC
  Administered 2023-03-08 – 2023-03-09 (×3): 650 mg via ORAL
  Filled 2023-03-08 (×3): qty 2

## 2023-03-08 MED ORDER — CEFAZOLIN IN SODIUM CHLORIDE 3-0.9 GM/100ML-% IV SOLN
3.0000 g | INTRAVENOUS | Status: AC
  Administered 2023-03-08: 3 g via INTRAVENOUS
  Filled 2023-03-08: qty 100

## 2023-03-08 MED ORDER — OXYCODONE HCL 5 MG PO TABS
5.0000 mg | ORAL_TABLET | ORAL | Status: DC | PRN
  Administered 2023-03-09: 5 mg via ORAL
  Filled 2023-03-08: qty 1

## 2023-03-08 MED ORDER — PROPOFOL 10 MG/ML IV BOLUS
INTRAVENOUS | Status: DC | PRN
  Administered 2023-03-08: 200 mg via INTRAVENOUS

## 2023-03-08 MED ORDER — CHLORHEXIDINE GLUCONATE CLOTH 2 % EX PADS: 6.0000 | MEDICATED_PAD | Freq: Once | CUTANEOUS | Status: DC

## 2023-03-08 MED ORDER — ACETAMINOPHEN 325 MG PO TABS: 325.0000 mg | ORAL_TABLET | ORAL | Status: DC | PRN

## 2023-03-08 MED ORDER — DROPERIDOL 2.5 MG/ML IJ SOLN: 0.6250 mg | Freq: Once | INTRAMUSCULAR | Status: DC | PRN

## 2023-03-08 MED ORDER — ROCURONIUM BROMIDE 10 MG/ML (PF) SYRINGE
PREFILLED_SYRINGE | INTRAVENOUS | Status: AC
  Filled 2023-03-08: qty 10

## 2023-03-08 MED ORDER — SODIUM CHLORIDE (PF) 0.9 % IJ SOLN
INTRAMUSCULAR | Status: AC
  Filled 2023-03-08: qty 10

## 2023-03-08 MED ORDER — ONDANSETRON HCL 4 MG/2ML IJ SOLN: 4.0000 mg | Freq: Four times a day (QID) | INTRAMUSCULAR | Status: DC | PRN

## 2023-03-08 MED ORDER — DEXAMETHASONE SODIUM PHOSPHATE 10 MG/ML IJ SOLN
INTRAMUSCULAR | Status: DC | PRN
  Administered 2023-03-08: 10 mg via INTRAVENOUS

## 2023-03-08 MED ORDER — SIMETHICONE 80 MG PO CHEW
40.0000 mg | CHEWABLE_TABLET | Freq: Four times a day (QID) | ORAL | Status: DC | PRN
  Administered 2023-03-08: 40 mg via ORAL
  Filled 2023-03-08: qty 1

## 2023-03-08 MED ORDER — ONDANSETRON HCL 4 MG/2ML IJ SOLN
INTRAMUSCULAR | Status: AC
  Filled 2023-03-08: qty 2

## 2023-03-08 MED ORDER — BUPIVACAINE LIPOSOME 1.3 % IJ SUSP
INTRAMUSCULAR | Status: DC | PRN
  Administered 2023-03-08: 20 mL

## 2023-03-08 MED ORDER — ACETAMINOPHEN 10 MG/ML IV SOLN: 1000.0000 mg | Freq: Once | INTRAVENOUS | Status: DC | PRN

## 2023-03-08 MED ORDER — PROMETHAZINE HCL 25 MG/ML IJ SOLN: 6.2500 mg | INTRAMUSCULAR | Status: DC | PRN

## 2023-03-08 MED ORDER — LACTATED RINGERS IV SOLN: INTRAVENOUS | Status: DC

## 2023-03-08 MED ORDER — 0.9 % SODIUM CHLORIDE (POUR BTL) OPTIME
TOPICAL | Status: DC | PRN
  Administered 2023-03-08: 1000 mL

## 2023-03-08 MED ORDER — LACTATED RINGERS IV SOLN: INTRAVENOUS | Status: DC | PRN

## 2023-03-08 MED ORDER — ACETAMINOPHEN 160 MG/5ML PO SOLN: 325.0000 mg | ORAL | Status: DC | PRN

## 2023-03-08 MED ORDER — OXYCODONE HCL 5 MG PO TABS: 5.0000 mg | ORAL_TABLET | Freq: Once | ORAL | Status: DC | PRN

## 2023-03-08 MED ORDER — ENOXAPARIN SODIUM 40 MG/0.4ML IJ SOSY
40.0000 mg | PREFILLED_SYRINGE | INTRAMUSCULAR | Status: DC
  Administered 2023-03-09: 40 mg via SUBCUTANEOUS
  Filled 2023-03-08: qty 0.4

## 2023-03-08 MED ORDER — BUPIVACAINE LIPOSOME 1.3 % IJ SUSP: 20.0000 mL | Freq: Once | INTRAMUSCULAR | Status: DC

## 2023-03-08 MED ORDER — ENOXAPARIN SODIUM 40 MG/0.4ML IJ SOSY
40.0000 mg | PREFILLED_SYRINGE | Freq: Once | INTRAMUSCULAR | Status: AC
  Administered 2023-03-08: 40 mg via SUBCUTANEOUS
  Filled 2023-03-08: qty 0.4

## 2023-03-08 MED ORDER — HYDROMORPHONE HCL 1 MG/ML IJ SOLN
INTRAMUSCULAR | Status: DC | PRN
  Administered 2023-03-08 (×4): .5 mg via INTRAVENOUS

## 2023-03-08 MED ORDER — OXYCODONE HCL 5 MG/5ML PO SOLN: 5.0000 mg | Freq: Once | ORAL | Status: DC | PRN

## 2023-03-08 MED ORDER — KETAMINE HCL 10 MG/ML IJ SOLN
INTRAMUSCULAR | Status: DC | PRN
  Administered 2023-03-08: 20 mg via INTRAVENOUS
  Administered 2023-03-08: 10 mg via INTRAVENOUS
  Administered 2023-03-08: 20 mg via INTRAVENOUS

## 2023-03-08 MED ORDER — PROCHLORPERAZINE EDISYLATE 10 MG/2ML IJ SOLN: 5.0000 mg | Freq: Four times a day (QID) | INTRAMUSCULAR | Status: DC | PRN

## 2023-03-08 MED ORDER — PROPOFOL 10 MG/ML IV BOLUS
INTRAVENOUS | Status: AC
  Filled 2023-03-08: qty 20

## 2023-03-08 MED ORDER — ORAL CARE MOUTH RINSE: 15.0000 mL | Freq: Once | OROMUCOSAL | Status: AC

## 2023-03-08 MED ORDER — ALBUTEROL SULFATE HFA 108 (90 BASE) MCG/ACT IN AERS: 2.0000 | INHALATION_SPRAY | Freq: Four times a day (QID) | RESPIRATORY_TRACT | Status: DC | PRN

## 2023-03-08 MED ORDER — KETOROLAC TROMETHAMINE 15 MG/ML IJ SOLN
15.0000 mg | Freq: Three times a day (TID) | INTRAMUSCULAR | Status: DC
  Administered 2023-03-08 – 2023-03-09 (×3): 15 mg via INTRAVENOUS
  Filled 2023-03-08 (×3): qty 1

## 2023-03-08 MED ORDER — ACETAMINOPHEN 500 MG PO TABS
1000.0000 mg | ORAL_TABLET | ORAL | Status: AC
  Administered 2023-03-08: 1000 mg via ORAL
  Filled 2023-03-08: qty 2

## 2023-03-08 MED ORDER — CHLORHEXIDINE GLUCONATE 0.12 % MT SOLN
15.0000 mL | Freq: Once | OROMUCOSAL | Status: AC
  Administered 2023-03-08: 15 mL via OROMUCOSAL

## 2023-03-08 MED ORDER — FENTANYL CITRATE (PF) 250 MCG/5ML IJ SOLN
INTRAMUSCULAR | Status: DC | PRN
  Administered 2023-03-08 (×6): 50 ug via INTRAVENOUS
  Administered 2023-03-08: 100 ug via INTRAVENOUS
  Administered 2023-03-08: 50 ug via INTRAVENOUS

## 2023-03-08 MED ORDER — HYDROMORPHONE HCL 2 MG/ML IJ SOLN
INTRAMUSCULAR | Status: AC
  Filled 2023-03-08: qty 1

## 2023-03-08 MED ORDER — ROCURONIUM BROMIDE 10 MG/ML (PF) SYRINGE
PREFILLED_SYRINGE | INTRAVENOUS | Status: DC | PRN
  Administered 2023-03-08: 30 mg via INTRAVENOUS
  Administered 2023-03-08 (×3): 40 mg via INTRAVENOUS
  Administered 2023-03-08: 80 mg via INTRAVENOUS
  Administered 2023-03-08: 40 mg via INTRAVENOUS
  Administered 2023-03-08: 10 mg via INTRAVENOUS

## 2023-03-08 MED ORDER — MIDAZOLAM HCL 2 MG/2ML IJ SOLN
INTRAMUSCULAR | Status: AC
  Filled 2023-03-08: qty 2

## 2023-03-08 MED ORDER — GABAPENTIN 300 MG PO CAPS
300.0000 mg | ORAL_CAPSULE | Freq: Three times a day (TID) | ORAL | Status: DC
  Administered 2023-03-08 (×2): 300 mg via ORAL
  Filled 2023-03-08: qty 1
  Filled 2023-03-08: qty 3

## 2023-03-08 MED ORDER — LIDOCAINE 2% (20 MG/ML) 5 ML SYRINGE
INTRAMUSCULAR | Status: DC | PRN
  Administered 2023-03-08: 100 mg via INTRAVENOUS

## 2023-03-08 MED ORDER — BUPIVACAINE-EPINEPHRINE 0.25% -1:200000 IJ SOLN
INTRAMUSCULAR | Status: DC | PRN
  Administered 2023-03-08: 30 mL

## 2023-03-08 MED ORDER — MIDAZOLAM HCL 2 MG/2ML IJ SOLN
INTRAMUSCULAR | Status: DC | PRN
  Administered 2023-03-08: 2 mg via INTRAVENOUS

## 2023-03-08 MED ORDER — PROCHLORPERAZINE MALEATE 10 MG PO TABS: 10.0000 mg | ORAL_TABLET | Freq: Four times a day (QID) | ORAL | Status: DC | PRN

## 2023-03-08 MED ORDER — FENTANYL CITRATE (PF) 250 MCG/5ML IJ SOLN
INTRAMUSCULAR | Status: AC
  Filled 2023-03-08: qty 5

## 2023-03-08 MED ORDER — FENTANYL CITRATE (PF) 100 MCG/2ML IJ SOLN
INTRAMUSCULAR | Status: AC
  Filled 2023-03-08: qty 2

## 2023-03-08 MED ORDER — FENTANYL CITRATE PF 50 MCG/ML IJ SOSY
PREFILLED_SYRINGE | INTRAMUSCULAR | Status: AC
  Administered 2023-03-08: 50 ug via INTRAVENOUS
  Filled 2023-03-08: qty 3

## 2023-03-08 MED ORDER — FENTANYL CITRATE PF 50 MCG/ML IJ SOSY: 25.0000 ug | PREFILLED_SYRINGE | INTRAMUSCULAR | Status: DC | PRN

## 2023-03-08 MED ORDER — DEXAMETHASONE SODIUM PHOSPHATE 10 MG/ML IJ SOLN
INTRAMUSCULAR | Status: AC
  Filled 2023-03-08: qty 1

## 2023-03-08 MED ORDER — KETAMINE HCL 50 MG/5ML IJ SOSY
PREFILLED_SYRINGE | INTRAMUSCULAR | Status: AC
  Filled 2023-03-08: qty 5

## 2023-03-08 MED ORDER — ONDANSETRON HCL 4 MG/2ML IJ SOLN
INTRAMUSCULAR | Status: DC | PRN
  Administered 2023-03-08: 4 mg via INTRAVENOUS

## 2023-03-08 MED ORDER — GABAPENTIN 300 MG PO CAPS
300.0000 mg | ORAL_CAPSULE | ORAL | Status: AC
  Administered 2023-03-08: 300 mg via ORAL
  Filled 2023-03-08: qty 1

## 2023-03-08 MED ORDER — ALUM & MAG HYDROXIDE-SIMETH 200-200-20 MG/5ML PO SUSP
30.0000 mL | Freq: Four times a day (QID) | ORAL | Status: DC | PRN
  Administered 2023-03-08: 30 mL via ORAL
  Filled 2023-03-08: qty 30

## 2023-03-08 MED ORDER — DOCUSATE SODIUM 100 MG PO CAPS
100.0000 mg | ORAL_CAPSULE | Freq: Two times a day (BID) | ORAL | Status: DC
  Administered 2023-03-08: 100 mg via ORAL
  Filled 2023-03-08: qty 1

## 2023-03-08 MED ORDER — KETOROLAC TROMETHAMINE 15 MG/ML IJ SOLN
15.0000 mg | INTRAMUSCULAR | Status: AC
  Administered 2023-03-08: 15 mg via INTRAVENOUS
  Filled 2023-03-08: qty 1

## 2023-03-08 MED ORDER — SUGAMMADEX SODIUM 200 MG/2ML IV SOLN
INTRAVENOUS | Status: DC | PRN
  Administered 2023-03-08: 400 mg via INTRAVENOUS

## 2023-03-08 MED ORDER — OXYCODONE HCL 5 MG PO TABS
10.0000 mg | ORAL_TABLET | ORAL | Status: DC | PRN
  Administered 2023-03-08 (×2): 10 mg via ORAL
  Filled 2023-03-08 (×2): qty 2

## 2023-03-08 MED ORDER — ONDANSETRON 4 MG PO TBDP: 4.0000 mg | ORAL_TABLET | Freq: Four times a day (QID) | ORAL | Status: DC | PRN

## 2023-03-08 SURGICAL SUPPLY — 68 items
ADH SKN CLS APL DERMABOND .7 (GAUZE/BANDAGES/DRESSINGS)
ANTIFOG SOL W/FOAM PAD STRL (MISCELLANEOUS) ×1
APL PRP STRL LF DISP 70% ISPRP (MISCELLANEOUS) ×1
BAG COUNTER SPONGE SURGICOUNT (BAG) ×2 IMPLANT
BAG SPNG CNTER NS LX DISP (BAG) ×1
BLADE SURG SZ11 CARB STEEL (BLADE) ×2 IMPLANT
CHLORAPREP W/TINT 26 (MISCELLANEOUS) ×1 IMPLANT
COVER MAYO STAND STRL (DRAPES) ×2 IMPLANT
COVER TIP SHEARS 8 DVNC (MISCELLANEOUS) ×1 IMPLANT
DERMABOND ADVANCED .7 DNX12 (GAUZE/BANDAGES/DRESSINGS) IMPLANT
DEVICE TROCAR PUNCTURE CLOSURE (ENDOMECHANICALS) IMPLANT
DRAPE ARM DVNC X/XI (DISPOSABLE) ×3 IMPLANT
DRAPE COLUMN DVNC XI (DISPOSABLE) ×1 IMPLANT
DRIVER NDL LRG 8 DVNC XI (INSTRUMENTS) ×2 IMPLANT
DRIVER NDL MEGA SUTCUT DVNCXI (INSTRUMENTS) ×4 IMPLANT
DRIVER NDLE LRG 8 DVNC XI (INSTRUMENTS)
DRIVER NDLE MEGA SUTCUT DVNCXI (INSTRUMENTS) ×1
ELECT L-HOOK LAP 45CM DISP (ELECTROSURGICAL) ×1
ELECT PENCIL ROCKER SW 15FT (MISCELLANEOUS) ×2 IMPLANT
ELECT REM PT RETURN 15FT ADLT (MISCELLANEOUS) ×1 IMPLANT
ELECTRODE L-HOOK LAP 45CM DISP (ELECTROSURGICAL) ×2 IMPLANT
FORCEPS PROGRASP DVNC XI (FORCEP) ×1 IMPLANT
GLOVE BIO SURGEON STRL SZ7.5 (GLOVE) ×4 IMPLANT
GLOVE INDICATOR 8.0 STRL GRN (GLOVE) ×4 IMPLANT
GOWN STRL REUS W/ TWL XL LVL3 (GOWN DISPOSABLE) ×3 IMPLANT
GOWN STRL REUS W/TWL XL LVL3 (GOWN DISPOSABLE) ×3
GRASPER SUT TROCAR 14GX15 (MISCELLANEOUS) IMPLANT
GRASPER TIP-UP FEN DVNC XI (INSTRUMENTS) ×1 IMPLANT
IRRIG SUCT STRYKERFLOW 2 WTIP (MISCELLANEOUS)
IRRIGATION SUCT STRKRFLW 2 WTP (MISCELLANEOUS) IMPLANT
KIT BASIN OR (CUSTOM PROCEDURE TRAY) ×1 IMPLANT
KIT TURNOVER KIT A (KITS) IMPLANT
MANIFOLD NEPTUNE II (INSTRUMENTS) ×2 IMPLANT
MESH SOFT 12X12IN BARD (Mesh General) IMPLANT
NDL SPNL 18GX3.5 QUINCKE PK (NEEDLE) ×1 IMPLANT
NEEDLE SPNL 18GX3.5 QUINCKE PK (NEEDLE) ×1
PACK CARDIOVASCULAR III (CUSTOM PROCEDURE TRAY) ×2 IMPLANT
SCISSORS MNPLR CVD DVNC XI (INSTRUMENTS) ×1 IMPLANT
SEAL UNIV 5-12 XI (MISCELLANEOUS) ×6 IMPLANT
SET TUBE SMOKE EVAC HIGH FLOW (TUBING) ×1 IMPLANT
SOL ELECTROSURG ANTI STICK (MISCELLANEOUS) ×1
SOLUTION ANTFG W/FOAM PAD STRL (MISCELLANEOUS) ×2 IMPLANT
SOLUTION ELECTROSURG ANTI STCK (MISCELLANEOUS) ×2 IMPLANT
SPIKE FLUID TRANSFER (MISCELLANEOUS) ×1 IMPLANT
STAPLER HERNIA 12 8.5 360D (INSTRUMENTS) IMPLANT
SUT DVC VLOC 180 2-0 12IN GS21 (SUTURE) ×1
SUT MNCRL AB 4-0 PS2 18 (SUTURE) ×2 IMPLANT
SUT STRAFIX PDS 18 CTX (SUTURE) IMPLANT
SUT STRAFIX SYMMETRIC 0-0 24 (SUTURE)
SUT STRAFIX SYMMETRIC 1-0 12 (SUTURE)
SUT STRAFIX SYMMETRIC 1-0 24 (SUTURE) ×1
SUT STRTFX SPIRAL PDS+ 2-0 23 (SUTURE)
SUT VIC AB 2-0 SH 27 (SUTURE) ×2
SUT VIC AB 2-0 SH 27X BRD (SUTURE) IMPLANT
SUT VICRYL 0 UR6 27IN ABS (SUTURE) IMPLANT
SUT VLOC 180 0 6IN GS21 (SUTURE) IMPLANT
SUT VLOC 180 0 9IN GS21 (SUTURE) IMPLANT
SUTURE DVC VL 180 2-0 12INGS21 (SUTURE) IMPLANT
SUTURE STRAFIX SYMMETRC 0-0 24 (SUTURE) IMPLANT
SUTURE STRAFIX SYMMETRC 1-0 12 (SUTURE) IMPLANT
SUTURE STRAFIX SYMMETRC 1-0 24 (SUTURE) IMPLANT
SUTURE STRTFX SPRL PDS+ 2-0 23 (SUTURE) IMPLANT
SYR 20ML LL LF (SYRINGE) ×1 IMPLANT
TAPE STRIPS DRAPE STRL (GAUZE/BANDAGES/DRESSINGS) ×2 IMPLANT
TOWEL OR 17X26 10 PK STRL BLUE (TOWEL DISPOSABLE) ×1 IMPLANT
TOWEL OR NON WOVEN STRL DISP B (DISPOSABLE) IMPLANT
TROCAR ADV FIXATION 12X100MM (TROCAR) ×1 IMPLANT
TROCAR Z-THREAD FIOS 5X100MM (TROCAR) ×1 IMPLANT

## 2023-03-08 NOTE — Anesthesia Postprocedure Evaluation (Signed)
Anesthesia Post Note  Patient: Chris Hamilton  Procedure(s) Performed: ROBOTIC RECURRENT INCISIONAL HERNIA REPAIR WITH MESH, BILATERAL POSTERIOR RECTUS MYOFASCIAL RELEASE, BILATERAL TRANSVERSUS ABDOMINIS RELEASE, PARTIAL REMOVAL OF INTRAPERITONEAL MESH     Patient location during evaluation: PACU Anesthesia Type: General Level of consciousness: awake and alert Pain management: pain level controlled Vital Signs Assessment: post-procedure vital signs reviewed and stable Respiratory status: spontaneous breathing, nonlabored ventilation, respiratory function stable and patient connected to nasal cannula oxygen Cardiovascular status: blood pressure returned to baseline and stable Postop Assessment: no apparent nausea or vomiting Anesthetic complications: yes   Encounter Notable Events  Notable Event Outcome Phase Comment  Difficult to intubate - expected  Intraprocedure Filed from anesthesia note documentation.    Last Vitals:  Vitals:   03/08/23 1504 03/08/23 1515  BP: (!) 124/99 (!) 130/94  Pulse: 77 75  Resp: 13 10  Temp: (!) 36.4 C   SpO2: 97% 96%    Last Pain:  Vitals:   03/08/23 1515  TempSrc:   PainSc: Asleep                 Trevor Iha

## 2023-03-08 NOTE — Anesthesia Procedure Notes (Addendum)
Procedure Name: Intubation Date/Time: 03/08/2023 10:17 AM  Performed by: Florene Route, CRNAPre-anesthesia Checklist: Patient identified, Emergency Drugs available, Suction available and Patient being monitored Patient Re-evaluated:Patient Re-evaluated prior to induction Oxygen Delivery Method: Circle system utilized Preoxygenation: Pre-oxygenation with 100% oxygen Induction Type: IV induction Ventilation: Mask ventilation without difficulty and Oral airway inserted - appropriate to patient size Laryngoscope Size: Glidescope and 4 Grade View: Grade I Tube type: Parker flex tip Tube size: 7.5 mm Number of attempts: 1 Airway Equipment and Method: Stylet Placement Confirmation: ETT inserted through vocal cords under direct vision, positive ETCO2 and breath sounds checked- equal and bilateral Secured at: 23 cm Tube secured with: Tape Dental Injury: Teeth and Oropharynx as per pre-operative assessment  Difficulty Due To: Difficulty was anticipated, Difficult Airway- due to anterior larynx and Difficult Airway- due to large tongue

## 2023-03-08 NOTE — Discharge Instructions (Signed)
 VENTRAL HERNIA REPAIR POST OPERATIVE INSTRUCTIONS  Thinking Clearly  The anesthesia may cause you to feel different for 1 or 2 days. Do not drive, drink alcohol, or make any big decisions for at least 2 days.  Nutrition When you wake up, you will be able to drink small amounts of liquid. If you do not feel sick, you can slowly advance your diet to regular foods. Continue to drink lots of fluids, usually about 8 to 10 glasses per day. Eat a high-fiber diet so you don't strain during bowel movements. High-Fiber Foods Foods high in fiber include beans, bran cereals and whole-grain breads, peas, dried fruit (figs, apricots, and dates), raspberries, blackberries, strawberries, sweet corn, broccoli, baked potatoes with skin, plums, pears, apples, greens, and nuts. Activity Slowly increase your activity. Be sure to get up and walk every hour or so to prevent blood clots. No heavy lifting or strenuous activity for 4 weeks following surgery to prevent hernias at your incision sites or recurrence of your hernia. It is normal to feel tired. You may need more sleep than usual.  Get your rest but make sure to get up and move around frequently to prevent blood clots and pneumonia.  Work and Return to School You can go back to work when you feel well enough. Discuss the timing with your surgeon. You can usually go back to school or work 1 week or less after an laparoscopic or an open repair. If your work requires heavy lifting or strenuous activity you need to be placed on light duty for 4 weeks following surgery. You can return to gym class, sports or other physical activities 4 weeks after surgery.  Wound Care You may experience significant bruising throughout the abdominal wall that may track down into the groin including into the scrotum in males.  Rest, elevating the groin and scrotum above the level of the heart, ice and compression with tight fitting underwear or an abdominal binder can help.   Always wash your hands before and after touching near your incision site. Do not soak in a bathtub until cleared at your follow up appointment. You may take a shower 24 hours after surgery. A small amount of drainage from the incision is normal. If the drainage is thick and yellow or the site is red, you may have an infection, so call your surgeon. If you have a drain in one of your incisions, it will be taken out in office when the drainage stops. Steri-Strips will fall off in 7 to 10 days or they will be removed during your first office visit. If you have dermabond glue covering over the incision, allow the glue to flake off on its own. Protect the new skin, especially from the sun. The sun can burn and cause darker scarring. Your scar will heal in about 4 to 6 weeks and will become softer and continue to fade over the next year.  The cosmetic appearance of the incisions will improve over the course of the first year after surgery. Sensation around your incision will return in a few weeks or months.  Bowel Movements After intestinal surgery, you may have loose watery stools for several days. If watery diarrhea lasts longer than 3 days, contact your surgeon. Pain medication (narcotics) can cause constipation. Increase the fiber in your diet with high-fiber foods if you are constipated. You can take an over the counter stool softener like Colace to avoid constipation.  Additional over the counter medications can also be used   if Colace isn't sufficient (for example, Milk of Magnesia or Miralax).  Pain The amount of pain is different for each person. Some people need only 1 to 3 doses of pain control medication, while others need more. Take alternating doses of tylenol and ibuprofen around the clock for the first five days following surgery.  This will provide a baseline of pain control and help with inflammation.  Take the narcotic pain medication in addition if needed for severe pain.  Contact  Your Surgeon at [phone], if you have: Pain that will not go away Pain that gets worse A fever of more than 101F (38.3C) Repeated vomiting Swelling, redness, bleeding, or bad-smelling drainage from your wound site Strong abdominal pain No bowel movement or unable to pass gas for 3 days Watery diarrhea lasting longer than 3 days  Pain Control The goal of pain control is to minimize pain, keep you moving and help you heal. Your surgical team will work with you on your pain plan. Most often a combination of therapies and medications are used to control your pain. You may also be given medication (local anesthetic) at the surgical site. This may help control your pain for several days. Extreme pain puts extra stress on your body at a time when your body needs to focus on healing. Do not wait until your pain has reached a level "10" or is unbearable before telling your doctor or nurse. It is much easier to control pain before it becomes severe. Following a laparoscopic procedure, pain is sometimes felt in the shoulder. This is due to the gas inserted into your abdomen during the procedure. Moving and walking helps to decrease the gas and the right shoulder pain.  Use the guide below for ways to manage your post-operative pain. Learn more by going to facs.org/safepaincontrol.  How Intense Is My Pain Common Therapies to Feel Better       I hardly notice my pain, and it does not interfere with my activities.  I notice my pain and it distracts me, but I can still do activities (sitting up, walking, standing).  Non-Medication Therapies  Ice (in a bag, applied over clothing at the surgical site), elevation, rest, meditation, massage, distraction (music, TV, play) walking and mild exercise Splinting the abdomen with pillows +  Non-Opioid Medications Acetaminophen (Tylenol) Non-steroidal anti-inflammatory drugs (NSAIDS) Aspirin, Ibuprofen (Motrin, Advil) Naproxen (Aleve) Take these as  needed, when you feel pain. Both acetaminophen and NSAIDs help to decrease pain and swelling (inflammation).      My pain is hard to ignore and is more noticeable even when I rest.  My pain interferes with my usual activities.  Non-Medication Therapies  +  Non-Opioid medications  Take on a regular schedule (around-the-clock) instead of as needed. (For example, Tylenol every 6 hours at 9:00 am, 3:00 pm, 9:00 pm, 3:00 am and Motrin every 6 hours at 12:00 am, 6:00 am, 12:00 pm, 6:00 pm)         I am focused on my pain, and I am not doing my daily activities.  I am groaning in pain, and I cannot sleep. I am unable to do anything.  My pain is as bad as it could be, and nothing else matters.  Non-Medication Therapies  +  Around-the-Clock Non-Opioid Medications  +  Short-acting opioids  Opioids should be used with other medications to manage severe pain. Opioids block pain and give a feeling of euphoria (feel high). Addiction, a serious side effect of opioids, is   rare with short-term (a few days) use.  Examples of short-acting opioids include: Tramadol (Ultram), Hydrocodone (Norco, Vicodin), Hydromorphone (Dilaudid), Oxycodone (Oxycontin)     The above directions have been adapted from the [employer] Surgical Patient Education Program.  Please refer to the ACS website if needed: [email or web address]://[email or web address]/-/media/files/[email or web address]   Paul Stechschulte, MD [facility] [address], [address], [place], [place]  [address] ?  [address] [address], [place], [place]   [address] [phone] ? [phone] ? FAX [phone] Web site: [email or web address]  

## 2023-03-08 NOTE — H&P (Signed)
Admitting Physician: Hyman Hopes   Service: General Surgery  CC: Recurrent hernia  Subjective   HPI: Chris Hamilton is a 47 y.o. male who is seen today as an office consultation for evaluation of Hernia  Chris Hamilton underwent hybrid laparoscopic repair of ventral incisional hernia, incarcerated containing omentum, total defect size 8 cm x 4 cm with Dr. Fredricka Bonine on 05/03/2022. A 4 inch by 6 inch Ventralight mesh was placed as an intraperitoneal onlay mesh. A small incision was made over the hernia, the sac was resected and the fascial defect was closed with 0 Ethibond suture.  On 11/16/21, he underwent laparoscopic cholecystectomy with an 11 mm trocar placed just above the umbilicus. This was with Dr. Gerrit Friends.  On 09/22/21, he underwent open incarcerated umbilical hernia repair with mesh and 6.4 cm intraperiotneal onlay ventralex ,meshpatch and 0 ethibond fascial closure with Dr. Fredricka Bonine.  He has recurrent bulge just to the left of his umbilicus. He is frustrated that this hernia is been fixed twice and is returned. It causes pain and discomfort. It has not interfered with bowel function. He would like it repaired again.     Past Medical History:  Diagnosis Date   Anxiety    Arthritis    Depression    Hepatitis C, chronic (HCC)    Incarcerated umbilical hernia    S/P repair by Dr. Fredricka Bonine 09/22/2021   IV drug abuse (HCC)    Shingles 2022    Past Surgical History:  Procedure Laterality Date   CHOLECYSTECTOMY N/A 11/16/2021   Procedure: LAPAROSCOPIC CHOLECYSTECTOMY;  Surgeon: Darnell Level, MD;  Location: [facility];  Service: General;  Laterality: N/A;   INCISIONAL HERNIA REPAIR N/A 05/03/2022   Procedure: LAPAROSCOPIC INCISIONAL/VENTRAL HERNIA REPAIR;  Surgeon: Berna Bue, MD;  Location: [facility];  Service: General;  Laterality: N/A;   UMBILICAL HERNIA REPAIR N/A 09/22/2021   Procedure: OPEN UMBILICAL HERNIA REPAIR WITH MESH;  Surgeon: Berna Bue, MD;  Location:  [facility];  Service: General;  Laterality: N/A;    Family History  Problem Relation Age of Onset   Cancer Mother    Miscarriages / [place] Mother    Cancer Father    Diabetes Maternal Grandmother    Cancer Maternal Grandmother    Heart disease Neg Hx     Social:  reports that he has been smoking cigarettes. He started smoking about 21 years ago. He has a 20 pack-year smoking history. He has never used smokeless tobacco. He reports that he does not currently use alcohol. He reports that he does not currently use drugs after having used the following drugs: Methamphetamines, Heroin, Cocaine, MDMA (Ecstacy), LSD, Marijuana, Amphetamines, Oxycodone, and Benzodiazepines.  Allergies: No Known Allergies  Medications: Current Outpatient Medications  Medication Instructions   albuterol (VENTOLIN HFA) 108 (90 Base) MCG/ACT inhaler 2 puffs, Inhalation, Every 6 hours PRN   diclofenac (VOLTAREN) 75 mg, Oral, 2 times daily   fluticasone (FLONASE) 50 MCG/ACT nasal spray 2 sprays, Each Nare, Daily    ROS - all of the below systems have been reviewed with the patient and positives are indicated with bold text General: chills, fever or night sweats Eyes: blurry vision or double vision ENT: epistaxis or sore throat Allergy/Immunology: itchy/watery eyes or nasal congestion Hematologic/Lymphatic: bleeding problems, blood clots or swollen lymph nodes Endocrine: temperature intolerance or unexpected weight changes Breast: new or changing breast lumps or nipple discharge Resp: cough, shortness of breath, or wheezing CV: chest pain or dyspnea on  exertion GI: as per HPI GU: dysuria, trouble voiding, or hematuria MSK: joint pain or joint stiffness Neuro: TIA or stroke symptoms Derm: pruritus and skin lesion changes Psych: anxiety and depression  Objective   PE Blood pressure (!) 126/92, pulse 65, temperature 98 F (36.7 C), temperature source Oral, resp. rate 16, SpO2  95%. Constitutional: NAD; conversant; no deformities Eyes: Moist conjunctiva; no lid lag; anicteric; PERRL Neck: Trachea midline; no thyromegaly Lungs: Normal respiratory effort; no tactile fremitus CV: RRR; no palpable thrills; no pitting edema GI: Abd palpable broad-based hernia at the umbilicus and more to the left of the abdomen. Feels reducible and fascial edges are palpable.  MSK: Normal range of motion of extremities; no clubbing/cyanosis Psychiatric: Appropriate affect; alert and oriented x3 Lymphatic: No palpable cervical or axillary lymphadenopathy  No results found for this or any previous visit (from the past 24 hour(s)).  Imaging Orders  No imaging studies ordered today   CT Abd/pel 01/25/23  9.7cm wide x 6.26 cm tall   Rectus diastasis with postop changes from previous ventral hernia repair. No evidence of recurrent hernia, abnormal fluid collection, or other acute findings.  Mild colonic diverticulosis, without radiographic evidence of diverticulitis.   Assessment and Plan   Recurrent incisional hernia   Chris Hamilton has a twice recurrent incisional hernia at the level of the umbilicus. It measures 9.7 cm wide by 6.26 cm tall on CT. I recommended robotic, possibly open recurrent incisional hernia repair with mesh. We discussed the procedure itself as well as its risk, benefits, and alternatives. I described I may or may not remove the previously placed intraperitoneal mesh. We discussed the postoperative recovery. After full discussion all questions answered the patient granted consent to proceed.   Quentin Ore, MD  [facility] Use [email or web address] to contact on call provider

## 2023-03-08 NOTE — Op Note (Addendum)
Patient: Chris Hamilton (1976/03/21, [id number])  Date of Surgery: 03/08/2023   Preoperative Diagnosis: Recurrent incisional hernia (9.7 cm wide by 6.26 cm tall on preoperative CT)  Postoperative Diagnosis: Recurrent incisional hernia (9.7 cm wide by 6.26 cm tall on preoperative CT)  Surgical Procedure: ROBOTIC RECURRENT INCISIONAL HERNIA REPAIR WITH MESH BILATERAL POSTERIOR RECTUS MYOFASCIAL RELEASE BILATERAL TRANSVERSUS ABDOMINIS RELEASE PARTIAL REMOVAL OF INTRAPERITONEAL MESH  Operative Team Members:  Surgeons and Role:    * , Hyman Hopes, MD - Primary   Anesthesiologist: Trevor Iha, MD CRNA: Doran Clay, CRNA; Key, Kristopher, CRNA; Florene Route, CRNA   Anesthesia: General   Fluids:  Total I/O In: 1800 [I.V.:1800] Out: 500 [Urine:400; Blood:100]  Complications: (1) Difficult to intubate - expected  Comments: Filed from anesthesia note documentation.  Drains:  None  Specimen: None   Disposition:  PACU - hemodynamically stable.  Plan of Care: Admit for overnight observation  Indications for Procedure: Chris Hamilton is a 47 y.o. male who presented with a recurrent incisional hernia.  Mr. Ghattas has a twice recurrent incisional hernia at the level of the umbilicus. It measures 9.7 cm wide by 6.26 cm tall on CT. I recommended robotic, possibly open recurrent incisional hernia repair with mesh. We discussed the procedure itself as well as its risk, benefits, and alternatives. I described I may or may not remove the previously placed intraperitoneal mesh. We discussed the postoperative recovery. After full discussion all questions answered the patient granted consent to proceed.   I recommended robotic ventral hernia repair with mesh.  The procedure itself as well as the risks, benefits and alternatives were described.  The risks discussed included but were not limited to the risk of infection, bleeding, damage to nearby structures,  recurrent hernia, chronic pain, and mesh complication requiring removal.  After a full discussion and all questions answered, the patient granted consent to proceed.  Findings:  Hernia Location: Ventral hernia location: Umbilical (M3) Hernia Size:  9.7 cm wide by 6.26 cm tall on preoperative CT Mesh Size &Type:  40 cm tall x 32 cm wide Bard Soft Mesh Mesh Position: Sublay - Retromuscular Myofascial Releases:  Bilateral posterior rectus myofascial release Bilateral transversus abdominus myofascial release  Partial mesh removal:   Description of Procedure: The patient was positioned supine, moderately flexed at the umbilical level, padded and secured on the operating table.  A timeout procedure was performed.    What is described is a robotic, totally extraperitoneal retromuscular incisional hernia repair with bilateral rectus myofascial release, bilateral transversus abdominis release and retromuscular mesh placement.  There was one variation in my usual technique.  A defect was created in the linea alba superiorly during the case causing a slight change in technique with closure of the diastasis as high as possible to repair the defect and mesh fixation using a transfascial vicryl suture to ensure closure near this subxiphoid defect.  Laparoscopic Portion: The retrorectus space was entered in the RIGHT hypochondrium, at approximately the midclavicular line utilizing a 5 mm optical-viewing trocar.  Upon safe entry into this space, it was insufflated while performing a blunt dissection with the camera still in the optical trocar. A rectus myofascial release was performed on the Right side. Dissection was carried out laterally in the retromuscular plane to the edge of the rectus sheath progressively disconnecting the rectus muscle from the underlying posterior rectus sheath. Both the segmental innervation as well as the intercostal artery and vein brances to the rectus muscle  were individually  preserved.    During the left sided retrorectus dissection, a 12 mm trocar was placed into the lateral most edge of the retrorectus space.  With these initial trocars in position, the medial most aspect of the retrorectus plane was identified, and the posterior sheath was visualized as it inserted on the linea alba. The posterior sheath was incised with cautery entering the preperitoneal plane. A crossover was performed dissecting under the linea alba in the preperitoneal plane until the left rectus sheath was identified.  After identification of the left rectus sheath, it was incised vertically to enter the retrorectus space on the left.  First attempt at entering the rectus sheath resulted in a defect in the linea alba as I was not lateral enough.  I was able to enter the rectus sheath on a second attempt. A rectus myofascial release was performed on the LEFT side.  Blunt dissection was carried out laterally in the retromuscular plane to the edge of the rectus sheath progressively disconnecting the rectus muscle from the underlying posterior rectus sheath. Both the segmental innervation as well as the intercostal artery and vein brances to the rectus muscle were individually preserved.   At this juncture, both retrorectus planes were initially connected to each other and there was space for further trocar placement. An 8 mm robotic trocar was placed in the midclavicular line in right retrorectus space.  A 8mm robotic trocar was placed within the left rectus musculature in the upper abdomen, and not through the linea alba.  The initial 5 mm access trocar in the midclavicular line within the left retrorectus space was switched out for an 8 mm robotic trocar.   Robotic Portion: The Intuitive daVinci Xi surgical robot was docked in the standard fashion and the procedure begun from the robotic console. A Prograsp instrument and monopolar shears were used for the dissection.  Dissection was carried down  inferiorly preserving the peritoneum and the preperitoneal fat in the midline as it was gently dissected off of the overlying linea alba.  On the right side, the posterior rectus sheath was progressively disconnected from its insertion on the linea alba. This allowed for progression of the right side rectus myofascial release.  The rectus myofascial release accomplished medialization of the posterior rectus sheath towards the midline and disinsertion of the rectus muscle from its surrounding fascia, and thus its encasement in the rectus sheath, allowing for widening of the rectus muscle and transfer of the rectus flap towards the midline.  This will allow for future inset of the medial aspect of the flap for abdominal wall reconstruction.  Similarly, on the left side, the posterior rectus sheath was also progressively disconnected from its insertion on the linea alba.  This allowed for progression of the left side rectus myofascial release.  The rectus myofascial release accomplished medialization of the posterior rectus sheath towards the midline and disinsertion of the rectus muscle from its surrounding fascia, and thus its encasement in the rectus sheath, allowing for widening of the rectus muscle and transfer of the rectus flap towards the midline.  This will allow for future inset of the medial aspect of the flap for abdominal wall reconstruction.  During the dissection of the midline the hernia defect was identified and the hernia sac was not reducible, therefore the hernia peritoneum was incised circumferentially around the edge of the hernia defect which left a defect within the peritoneum in the midline.    Two previously placed polyethylene mesh were encountered.  They had moved to the right allowing the hernia to recur around the left side of the meshes.  A piece of the mesh was folded and not well incorporated, so this was removed.  Please see photo.  The remainder of the mesh was well incorporated  and remained as part of the posterior closure of the hernia defect.  The posterior layer was closed with a running 2-0 v-loc suture.  Both the left and the right rectus myofascial releases were performed towards the lower abdomen, past the arcuate line bilaterally.  During this dissection, the peritoneum and preperitoneal fat in the midline were further preserved below the hernia as they were dissected off of the overlying linea alba.   Due to the size of the defect and questionable quality of tissue in the area of previous mesh placement, I performed bilateral TARs to allow for additional mesh overlap of the hernia defect and release tension on the posterior closure.  A transversus abdominis release (TAR) was performed on the right side.  The transversus abdominis muscle was identified deep to the posterior rectus sheath and incised vertically along its entire length, entering the pre-peritoneal or pre-transversalis fascia plane.  This disinserted the transversus abdominis muscle from the linea semilunaris.  Since the intercostal nerves, arteries and veins had been preserved during the rectus myofascial release portion of the procedure, they remained intact during the TAR. The peritoneum was subsequently peeled away from the underside of the divided transversus abdominis muscle.  This dissection was carried out laterally towards the retroperitoneum.  The TAR accomplished additional medialization of the posterior rectus sheath with its attached peritoneum towards the midline to allow for visceral sac closure.  The TAR also provided further offset of tension of the rectus muscle flap with additional transfer of the rectus muscle towards the midline, as it remained attached to the external and internal abdominal oblique muscles.  This will allow for future inset of the medial aspect of the flap for abdominal wall reconstruction.   A transversus abdominis release (TAR) was performed on the left side.  The  transversus abdominis muscle was identified deep to the posterior rectus sheath and incised vertically along its entire length, entering the pre-peritoneal or pre-transversalis fascia plane.  This disinserted the transversus abdominis muscle from the linea semilunaris.  Since the intercostal nerves, arteries and veins had been preserved during the rectus myofascial release portion of the procedure, they remained intact during the TAR. The peritoneum was subsequently peeled away from the underside of the divided transversus abdominis muscle.  This dissection was carried out laterally towards the retroperitoneum.  The TAR accomplished additional medialization of the posterior rectus sheath with its attached peritoneum towards the midline to allow for visceral sac closure.  The TAR also provided further offset of tension of the rectus muscle flap with additional transfer of the rectus muscle towards the midline, as it remained attached to the external and internal abdominal oblique muscles.  This will allow for future inset of the medial aspect of the flap for abdominal wall reconstruction.   The hernia defect area was now visualized fully.  The hernia defects were located in the umbilical regions.   The hernia defect was closed utilizing a continuous, #1 Ethicon Stratafix Symmetric PDS Plus suture.  The hernia defect, and subsequently the rectus musculature, came together well for a complete abdominal wall reconstruction.  The upper midline diastasis was closed as well and the defect created in the linea alba was closed during this portion  of the procedure.  The dissected out retrorectus space was measured with a metric ruler so as to determine the size of the proposed mesh.    The robot was undocked and the laparoscope was inserted, inspecting for hemostasis.  The mesh deployment was performed laparoscopically.  Laparoscopic Portion:  A transversus abdominis plane (TAP) block was performed bilaterally with  a mixture of marcaine and Exparel.  The anesthetic was first injected into the plane between the transversus abdominis and internal abdominal oblique muscles on the left. The TAP was repeated on the contralateral side.   A piece of Bard soft was opened and trimmed with the midline as the diagonal to 32 cm wide x 40 cm tall. The mesh was advanced into the retrorectus space and the mesh positioned flat against the intact posterior rectus sheaths. The mesh was fixated at the camera port using an 0-vicryl transfascial suture to fix the mesh in place near the xiphoid and close the camera port defect.  This was done to ensure mesh overlap of the defect created in the superior linea alba during the crossover portion of the procedure.   The trocars were removed and the skin closed with 4-0 Monocryl subcuticular sutures and skin glue.   Ivar Drape, MD General, Bariatric, & Minimally Invasive Surgery [facility], [place]

## 2023-03-08 NOTE — Transfer of Care (Signed)
Immediate Anesthesia Transfer of Care Note  Patient: Chris Hamilton  Procedure(s) Performed: ROBOTIC RECURRENT INCISIONAL HERNIA REPAIR WITH MESH, BILATERAL POSTERIOR RECTUS MYOFASCIAL RELEASE, BILATERAL TRANSVERSUS ABDOMINIS RELEASE, PARTIAL REMOVAL OF INTRAPERITONEAL MESH  Patient Location: PACU  Anesthesia Type:General  Level of Consciousness: drowsy  Airway & Oxygen Therapy: Patient Spontanous Breathing and Patient connected to face mask oxygen  Post-op Assessment: Report given to RN and Post -op Vital signs reviewed and stable  Post vital signs: Reviewed and stable  Last Vitals:  Vitals Value Taken Time  BP 147/109 03/08/23 1432  Temp    Pulse 88 03/08/23 1435  Resp 21 03/08/23 1435  SpO2 100 % 03/08/23 1435  Vitals shown include unfiled device data.  Last Pain:  Vitals:   03/08/23 0848  TempSrc:   PainSc: 0-No pain      Patients Stated Pain Goal: 4 (03/08/23 0848)  Complications:  Encounter Notable Events  Notable Event Outcome Phase Comment  Difficult to intubate - expected  Intraprocedure Filed from anesthesia note documentation.

## 2023-03-09 ENCOUNTER — Encounter (HOSPITAL_COMMUNITY): Payer: Self-pay | Admitting: Surgery

## 2023-03-09 DIAGNOSIS — K432 Incisional hernia without obstruction or gangrene: Secondary | ICD-10-CM | POA: Diagnosis not present

## 2023-03-09 LAB — BASIC METABOLIC PANEL
Anion gap: 7 (ref 5–15)
BUN: 12 mg/dL (ref 6–20)
CO2: 25 mmol/L (ref 22–32)
Calcium: 8.2 mg/dL — ABNORMAL LOW (ref 8.9–10.3)
Chloride: 104 mmol/L (ref 98–111)
Creatinine, Ser: 0.67 mg/dL (ref 0.61–1.24)
GFR, Estimated: >60 mL/min (ref >60)
Glucose, Bld: 106 mg/dL — ABNORMAL HIGH (ref 70–99)
Potassium: 3.7 mmol/L (ref 3.5–5.1)
Sodium: 136 mmol/L (ref 135–145)

## 2023-03-09 LAB — CBC
HCT: 31.7 % — ABNORMAL LOW (ref 39.0–52.0)
Hemoglobin: 10.3 g/dL — ABNORMAL LOW (ref 13.0–17.0)
MCH: 28.2 pg (ref 26.0–34.0)
MCHC: 32.5 g/dL (ref 30.0–36.0)
MCV: 86.8 fL (ref 80.0–100.0)
Platelets: 139 10*3/uL — ABNORMAL LOW (ref 150–400)
RBC: 3.65 MIL/uL — ABNORMAL LOW (ref 4.22–5.81)
RDW: 12.6 % (ref 11.5–15.5)
WBC: 10.7 10*3/uL — ABNORMAL HIGH (ref 4.0–10.5)
nRBC: 0 % (ref 0.0–0.2)

## 2023-03-09 MED ORDER — OXYCODONE-ACETAMINOPHEN 5-325 MG PO TABS: 1.0000 | ORAL_TABLET | ORAL | 0 refills | Status: DC | PRN

## 2023-03-09 MED ORDER — METHOCARBAMOL 750 MG PO TABS: 750.0000 mg | ORAL_TABLET | Freq: Four times a day (QID) | ORAL | 0 refills | Status: DC | PRN

## 2023-03-09 NOTE — Discharge Summary (Signed)
  Patient ID: Chris Hamilton [id number] 47 y.o. 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/15/2021   Obesity (BMI 30-39.9) 11/15/2021   Status post laparoscopic cholecystectomy 11/14/2021     Discharge Diagnoses:  Patient Active Problem List   Diagnosis Date Noted   Recurrent incisional hernia 03/08/2023   Depression 12/23/2021   Status post umbilical hernia repair, follow-up exam 12/23/2021   Chronic hepatitis C without hepatic coma (HCC) 12/22/2021   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/15/2021   Obesity (BMI 30-39.9) 11/15/2021   Status post laparoscopic cholecystectomy 11/14/2021    Operations: Procedure(s): ROBOTIC RECURRENT INCISIONAL HERNIA REPAIR WITH MESH, BILATERAL POSTERIOR RECTUS MYOFASCIAL RELEASE, BILATERAL TRANSVERSUS ABDOMINIS RELEASE, PARTIAL REMOVAL OF INTRAPERITONEAL MESH  Admission Condition: good  Discharged Condition: good  Indication for Admission: Recurrent incisional hernia  Hospital Course: Robotic hernia repair  Consults: None  Significant Diagnostic Studies: None  Treatments: surgery  Disposition: Home  Patient Instructions:  Allergies as of 03/09/2023   No Known Allergies      Medication List     STOP taking these medications    diclofenac 75 MG EC tablet Commonly known as: VOLTAREN   fluticasone 50 MCG/ACT nasal spray Commonly known as: FLONASE       TAKE these medications    albuterol 108 (90 Base) MCG/ACT inhaler Commonly known as: VENTOLIN HFA Inhale 2  puffs into the lungs every 6 (six) hours as needed for wheezing or shortness of breath.   methocarbamol 750 MG tablet Commonly known as: Robaxin-750 Take 1 tablet (750 mg total) by mouth every 6 (six) hours as needed for muscle spasms.   oxyCODONE-acetaminophen 5-325 MG tablet Commonly known as: Percocet Take 1 tablet by mouth every 4 (four) hours as needed for severe pain.        Activity: no heavy lifting for 4 weeks Diet: regular diet Wound Care: keep wound clean and dry  Follow-up:  With Dr. Dossie Der  Signed: Hyman Hopes  General, Bariatric, & Minimally Invasive Surgery [facility], [place]   03/09/2023, 8:23 AM

## 2023-03-09 NOTE — Progress Notes (Signed)
   03/09/23 1029  TOC Brief Assessment  Insurance and Status Reviewed  Patient has primary care physician Yes  Home environment has been reviewed home  Prior level of function: independent  Prior/Current Home Services No current home services  Social Determinants of Health Reivew SDOH reviewed no interventions necessary  Readmission risk has been reviewed Yes  Transition of care needs no transition of care needs at this time

## 2023-03-09 NOTE — Plan of Care (Signed)
Patient voiding, Ambulated 220ft in the hallway over night.   Patient tolerating clears, passing gas. No IV medications used overnight.

## 2023-03-09 NOTE — Progress Notes (Signed)
Nurse reviewed discharge instructions with pt.  Pt verbalized understanding of discharge instructions, follow up appointments and new medications.  No concerns at time of discharge. 

## 2023-03-31 ENCOUNTER — Ambulatory Visit (HOSPITAL_BASED_OUTPATIENT_CLINIC_OR_DEPARTMENT_OTHER): Payer: BC Managed Care – PPO | Admitting: Orthopaedic Surgery

## 2023-03-31 DIAGNOSIS — M1711 Unilateral primary osteoarthritis, right knee: Secondary | ICD-10-CM

## 2023-03-31 DIAGNOSIS — M170 Bilateral primary osteoarthritis of knee: Secondary | ICD-10-CM | POA: Diagnosis not present

## 2023-03-31 DIAGNOSIS — M25561 Pain in right knee: Secondary | ICD-10-CM

## 2023-03-31 DIAGNOSIS — M25562 Pain in left knee: Secondary | ICD-10-CM

## 2023-03-31 DIAGNOSIS — M1712 Unilateral primary osteoarthritis, left knee: Secondary | ICD-10-CM

## 2023-03-31 MED ORDER — LIDOCAINE HCL 1 % IJ SOLN
4.0000 mL | INTRAMUSCULAR | Status: AC | PRN
Start: 2023-03-31 — End: 2023-03-31
  Administered 2023-03-31: 4 mL

## 2023-03-31 MED ORDER — TRIAMCINOLONE ACETONIDE 40 MG/ML IJ SUSP
80.0000 mg | INTRAMUSCULAR | Status: AC | PRN
Start: 2023-03-31 — End: 2023-03-31
  Administered 2023-03-31: 80 mg via INTRA_ARTICULAR

## 2023-03-31 NOTE — Progress Notes (Signed)
Chief Complaint: Left knee pain, right knee pain     History of Present Illness:   03/31/2023: Presents today for follow-up of bilateral knees.  He is hoping to have additional injections today.  He does have an upcoming wedding with his brother.  Chris Hamilton is a 47 y.o. male presents today with ongoing left knee pain.  He states that when he was 16 he was diagnosed with a meniscus tear and subsequently surgery was deferred and he did come with respiratory illness.  After that time he began experiencing some pain relief and as result the knee was not further addressed.  He does state that the knee is continue to feel unstable and painful.  He will occasionally feel pops in the knee.  He does have mechanical symptoms involving the knee.  He has not had any physical therapy.  He states that the knee continues to give out on him.  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  He is here today for further assessment.  He is also complaining of bilateral heel pain.  This is occurring on the medial aspect of the arch.  This has been going on for several months now.  He has used a frozen water bottle about of the heel to help.    Surgical History:   None  PMH/PSH/Family History/Social History/Meds/Allergies:    Past Medical History:  Diagnosis Date  . Anxiety   . Arthritis   . Depression   . Hepatitis C, chronic (HCC)   . Incarcerated umbilical hernia    S/P repair by Dr. Fredricka Bonine 09/22/2021  . IV drug abuse (HCC)   . Shingles 2022   Past Surgical History:  Procedure Laterality Date  . CHOLECYSTECTOMY N/A 11/16/2021   Procedure: LAPAROSCOPIC CHOLECYSTECTOMY;  Surgeon: Darnell Level, MD;  Location: [facility];  Service: General;  Laterality: N/A;  . INCISIONAL HERNIA REPAIR N/A 05/03/2022   Procedure: LAPAROSCOPIC INCISIONAL/VENTRAL HERNIA REPAIR;  Surgeon: Berna Bue, MD;  Location: [facility];   Service: General;  Laterality: N/A;  . UMBILICAL HERNIA REPAIR N/A 09/22/2021   Procedure: OPEN UMBILICAL HERNIA REPAIR WITH MESH;  Surgeon: Berna Bue, MD;  Location: [facility];  Service: General;  Laterality: N/A;  . XI ROBOTIC ASSISTED VENTRAL HERNIA N/A 03/08/2023   Procedure: ROBOTIC RECURRENT INCISIONAL HERNIA REPAIR WITH MESH, BILATERAL POSTERIOR RECTUS MYOFASCIAL RELEASE, BILATERAL TRANSVERSUS ABDOMINIS RELEASE, PARTIAL REMOVAL OF INTRAPERITONEAL MESH;  Surgeon: Quentin Ore, MD;  Location: [facility];  Service: General;  Laterality: N/A;   Social History   Socioeconomic History  . Marital status: Single    Spouse name: Not on file  . Number of children: Not on file  . Years of education: Not on file  . Highest education level: Not on file  Occupational History  . Not on file  Tobacco Use  . Smoking status: Every Day    Current packs/day: 0.00    Average packs/day: 1 pack/day for 20.0 years (20.0 ttl pk-yrs)    Types: Cigarettes    Start date: 2003    Last attempt to quit: 2023    Years since quitting: 1.6  . Smokeless tobacco: Never  . Tobacco comments:    Vapes daily. Quit cigarettes  Vaping Use  .  Vaping status: Every Day  . Substances: Nicotine, Flavoring  Substance and Sexual Activity  . Alcohol use: Not Currently  . Drug use: Not Currently    Types: Methamphetamines, Heroin, Cocaine, MDMA (Ecstacy), LSD, Marijuana, Amphetamines, Oxycodone, Benzodiazepines    Comment: past meth and heroin use, currently 03/08/23 takes 30 Kratom per day  . Sexual activity: Yes  Other Topics Concern  . Not on file  Social History Narrative  . Not on file   Social Determinants of Health   Financial Resource Strain: Not on file  Food Insecurity: No Food Insecurity (03/08/2023)   Hunger Vital Sign   . Worried About Programme researcher, broadcasting/film/video in the Last Year: Never true   . Ran Out of Food in the Last Year: Never true  Transportation Needs: No Transportation  Needs (03/08/2023)   PRAPARE - Transportation   . Lack of Transportation (Medical): No   . Lack of Transportation (Non-Medical): No  Physical Activity: Not on file  Stress: Not on file  Social Connections: Not on file   Family History  Problem Relation Age of Onset  . Cancer Mother   . Miscarriages / [place] Mother   . Cancer Father   . Diabetes Maternal Grandmother   . Cancer Maternal Grandmother   . Heart disease Neg Hx    No Known Allergies Current Outpatient Medications  Medication Sig Dispense Refill  . albuterol (VENTOLIN HFA) 108 (90 Base) MCG/ACT inhaler Inhale 2 puffs into the lungs every 6 (six) hours as needed for wheezing or shortness of breath. 8 g 2  . methocarbamol (ROBAXIN-750) 750 MG tablet Take 1 tablet (750 mg total) by mouth every 6 (six) hours as needed for muscle spasms. 30 tablet 0  . oxyCODONE-acetaminophen (PERCOCET) 5-325 MG tablet Take 1 tablet by mouth every 4 (four) hours as needed for severe pain. 10 tablet 0   No current facility-administered medications for this visit.   No results found.  Review of Systems:   A ROS was performed including pertinent positives and negatives as documented in the HPI.  Physical Exam :   Constitutional: NAD and appears stated age Neurological: Alert and oriented Psych: Appropriate affect and cooperative There were no vitals taken for this visit.   Comprehensive Musculoskeletal Exam:      Musculoskeletal Exam  Gait Normal  Alignment Normal   Right Left  Inspection Normal Normal  Palpation    Tenderness Medial joint Medial joint  Crepitus None None  Effusion None None  Range of Motion    Extension 0 0  Flexion 135 135  Strength    Extension 5/5 5/5  Flexion 5/5 5/5  Ligament Exam     Generalized Laxity No No  Lachman Negative Negative   Pivot Shift Negative Negative  Anterior Drawer Negative Negative  Valgus at 0 Negative Negative  Valgus at 20 Negative Negative  Varus at 0 0 0  Varus at 20    0 0  Posterior Drawer at 90 0 0  Vascular/Lymphatic Exam    Edema None None  Venous Stasis Changes No No  Distal Circulation Normal Normal  Neurologic    Light Touch Sensation Intact Intact  Special Tests:      Imaging:   Xray (4 views left knee, 4 views right knee): Normal  MRI left knee: He has significant medial meniscal tearing and extrusion with end-stage arthritis of the medial joint space.  There is evidence of an old PCL injury as well  I personally reviewed and  interpreted the radiographs.   Assessment:   47 y.o. 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  I did describe that unfortunately his left worse than right these do have evidence of early arthritis.  This time he would like to proceed with ultrasound-guided injections of both knees for hopefully longer lasting relief.  Will plan to proceed with this.  I will plan to see him back as needed Plan :    -Bilateral ultrasound-guided knee injections performed after verbal consent obtained     Procedure Note  Patient: Chris Hamilton             Date of Birth: 07-09-1976           MRN: [id number]             Visit Date: 03/31/2023  Procedures: Visit Diagnoses: No diagnosis found.  Large Joint Inj: R knee on 03/31/2023 8:54 AM Indications: pain Details: 22 G 1.5 in needle, ultrasound-guided anterior approach  Arthrogram: No  Medications: 4 mL lidocaine 1 %; 80 mg triamcinolone acetonide 40 MG/ML Outcome: tolerated well, no immediate complications Procedure, treatment alternatives, risks and benefits explained, specific risks discussed. Consent was given by the patient. Immediately prior to procedure a time out was called to verify the correct patient, procedure, equipment, support staff and site/side marked as required. Patient was prepped and draped in the usual sterile fashion.    Large Joint Inj: L knee on 03/31/2023 8:55 AM Indications:  pain Details: 22 G 1.5 in needle, ultrasound-guided anterior approach  Arthrogram: No  Medications: 4 mL lidocaine 1 %; 80 mg triamcinolone acetonide 40 MG/ML Outcome: tolerated well, no immediate complications Procedure, treatment alternatives, risks and benefits explained, specific risks discussed. Consent was given by the patient. Immediately prior to procedure a time out was called to verify the correct patient, procedure, equipment, support staff and site/side marked as required. Patient was prepped and draped in the usual sterile fashion.          I personally saw and evaluated the patient, and participated in the management and treatment plan.  Huel Cote, MD Attending Physician, Orthopedic Surgery  This document was dictated using Dragon voice recognition software. A reasonable attempt at proof reading has been made to minimize errors.

## 2023-05-26 ENCOUNTER — Encounter: Payer: Self-pay | Admitting: Family

## 2023-05-26 ENCOUNTER — Ambulatory Visit (INDEPENDENT_AMBULATORY_CARE_PROVIDER_SITE_OTHER): Payer: BC Managed Care – PPO | Admitting: Family

## 2023-05-26 VITALS — BP 130/88 | HR 73 | Resp 18 | Ht 74.0 in | Wt 281.2 lb

## 2023-05-26 DIAGNOSIS — H00011 Hordeolum externum right upper eyelid: Secondary | ICD-10-CM

## 2023-05-26 DIAGNOSIS — R030 Elevated blood-pressure reading, without diagnosis of hypertension: Secondary | ICD-10-CM

## 2023-05-26 DIAGNOSIS — L709 Acne, unspecified: Secondary | ICD-10-CM | POA: Diagnosis not present

## 2023-05-26 MED ORDER — TOBRAMYCIN 0.3 % OP SOLN: 1.0000 [drp] | Freq: Four times a day (QID) | OPHTHALMIC | 0 refills | Status: AC

## 2023-05-26 NOTE — Progress Notes (Signed)
Chris Hamilton is a 47 y.o. male with the following history as recorded in Epic[employer]:  Patient Active Problem List   Diagnosis Date Noted   Recurrent incisional hernia 03/08/2023   Depression 12/23/2021   Status post umbilical hernia repair, follow-up exam 12/23/2021   Chronic hepatitis C without hepatic coma (HCC) 12/22/2021   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/15/2021   Obesity (BMI 30-39.9) 11/15/2021   Status post laparoscopic cholecystectomy 11/14/2021    Current Outpatient Medications  Medication Sig Dispense Refill   tobramycin (TOBREX) 0.3 % ophthalmic solution Place 1 drop into the right eye every 6 (six) hours. 5 mL 0   albuterol (VENTOLIN HFA) 108 (90 Base) MCG/ACT inhaler Inhale 2 puffs into the lungs every 6 (six) hours as needed for wheezing or shortness of breath. (Patient not taking: Reported on 05/26/2023) 8 g 2   No current facility-administered medications for this visit.    Allergies: Patient has no known allergies.  Past Medical History:  Diagnosis Date   Anxiety    Arthritis    Depression    Hepatitis C, chronic (HCC)    Incarcerated umbilical hernia    S/P repair by Dr. Fredricka Bonine 09/22/2021   IV drug abuse (HCC)    Shingles 2022    Past Surgical History:  Procedure Laterality Date   CHOLECYSTECTOMY N/A 11/16/2021   Procedure: LAPAROSCOPIC CHOLECYSTECTOMY;  Surgeon: Darnell Level, MD;  Location: [facility];  Service: General;  Laterality: N/A;   INCISIONAL HERNIA REPAIR N/A 05/03/2022   Procedure: LAPAROSCOPIC INCISIONAL/VENTRAL HERNIA REPAIR;  Surgeon: Berna Bue, MD;  Location: [facility];  Service: General;  Laterality: N/A;   UMBILICAL HERNIA REPAIR N/A 09/22/2021   Procedure: OPEN UMBILICAL HERNIA REPAIR WITH MESH;  Surgeon: Berna Bue, MD;  Location: [facility];  Service: General;  Laterality: N/A;   XI ROBOTIC ASSISTED VENTRAL HERNIA N/A 03/08/2023   Procedure: ROBOTIC RECURRENT INCISIONAL HERNIA REPAIR WITH MESH,  BILATERAL POSTERIOR RECTUS MYOFASCIAL RELEASE, BILATERAL TRANSVERSUS ABDOMINIS RELEASE, PARTIAL REMOVAL OF INTRAPERITONEAL MESH;  Surgeon: Quentin Ore, MD;  Location: [facility];  Service: General;  Laterality: N/A;    Family History  Problem Relation Age of Onset   Cancer Mother    Miscarriages / [place] Mother    Cancer Father    Diabetes Maternal Grandmother    Cancer Maternal Grandmother    Heart disease Neg Hx     Social History   Tobacco Use   Smoking status: Every Day    Current packs/day: 0.00    Average packs/day: 1 pack/day for 20.0 years (20.0 ttl pk-yrs)    Types: Cigarettes    Start date: 2003    Last attempt to quit: 2023    Years since quitting: 1.8   Smokeless tobacco: Never   Tobacco comments:    Vapes daily. Quit cigarettes  Substance Use Topics   Alcohol use: Not Currently    Subjective:   Possible stye in right upper eyelid; symptoms x 3 days; no vision changes; noticed "white bump" this morning;   Also requesting dermatology referral to persistent acne on back;    Objective:  Vitals:   05/26/23 1355 05/26/23 1616  BP: (!) 140/90 130/88  Pulse: 73   Resp: 18   SpO2: 96%   Weight: 281 lb 3.2 oz (127.6 kg)   Height: 6\' 2"  (1.88 m)     General: Well developed, well nourished, in no acute distress  Skin : Warm and dry.  Head:  Normocephalic and atraumatic  Eyes: Sclera and conjunctiva clear; pupils round and reactive to light; extraocular movements intact; stye noted on right upper lid Ears: External normal; canals clear; tympanic membranes normal  Oropharynx: Pink, supple. No suspicious lesions  Neck: Supple without thyromegaly, adenopathy  Lungs: Respirations unlabored;  Neurologic: Alert and oriented; speech intact; face symmetrical; moves all extremities well; CNII-XII intact without focal deficit   Assessment:  1. Acne, unspecified acne type   2. Hordeolum externum of right upper eyelid   3. Elevated blood pressure reading      Plan:  Referral to dermatology as requested; Encouraged to apply warm compresses to affected area 4x per day; Rx for Tobramycin Opht drops- use as directed;  DASH diet provided for patient; needs to quit smoking;   No follow-ups on file.  Orders Placed This Encounter  Procedures   Ambulatory referral to Dermatology    Referral Priority:   Routine    Referral Type:   Consultation    Referral Reason:   Specialty Services Required    Requested Specialty:   Dermatology    Number of Visits Requested:   1    Requested Prescriptions   Signed Prescriptions Disp Refills   tobramycin (TOBREX) 0.3 % ophthalmic solution 5 mL 0    Sig: Place 1 drop into the right eye every 6 (six) hours.

## 2023-05-30 ENCOUNTER — Other Ambulatory Visit: Payer: Self-pay | Admitting: Family

## 2023-05-30 ENCOUNTER — Telehealth: Payer: Self-pay | Admitting: Family

## 2023-05-30 DIAGNOSIS — L709 Acne, unspecified: Secondary | ICD-10-CM

## 2023-05-30 NOTE — Telephone Encounter (Signed)
[facility] wanted to let us know they are limiting new patients and will not be able to see this pt for his acne issues. FYI

## 2023-07-27 ENCOUNTER — Encounter (HOSPITAL_BASED_OUTPATIENT_CLINIC_OR_DEPARTMENT_OTHER): Payer: Self-pay | Admitting: Orthopaedic Surgery

## 2023-07-28 ENCOUNTER — Ambulatory Visit (HOSPITAL_BASED_OUTPATIENT_CLINIC_OR_DEPARTMENT_OTHER): Payer: BC Managed Care – PPO | Admitting: Student

## 2023-07-28 ENCOUNTER — Encounter (HOSPITAL_BASED_OUTPATIENT_CLINIC_OR_DEPARTMENT_OTHER): Payer: Self-pay | Admitting: Student

## 2023-07-28 DIAGNOSIS — M1712 Unilateral primary osteoarthritis, left knee: Secondary | ICD-10-CM | POA: Diagnosis not present

## 2023-07-28 DIAGNOSIS — M1711 Unilateral primary osteoarthritis, right knee: Secondary | ICD-10-CM

## 2023-07-28 DIAGNOSIS — M170 Bilateral primary osteoarthritis of knee: Secondary | ICD-10-CM

## 2023-07-28 MED ORDER — TRIAMCINOLONE ACETONIDE 40 MG/ML IJ SUSP
2.0000 mL | INTRAMUSCULAR | Status: AC | PRN
Start: 2023-07-28 — End: 2023-07-28
  Administered 2023-07-28: 2 mL via INTRA_ARTICULAR

## 2023-07-28 MED ORDER — LIDOCAINE HCL 1 % IJ SOLN
4.0000 mL | INTRAMUSCULAR | Status: AC | PRN
Start: 2023-07-28 — End: 2023-07-28
  Administered 2023-07-28: 4 mL

## 2023-07-28 NOTE — Progress Notes (Signed)
 Chief Complaint: Bilateral knee pain     History of Present Illness:   07/28/2023: Patient presents today for follow-up evaluation and injections of both knees.  States that last cortisone injections on 9/6 by Dr. Genelle did provide relief up until 2 weeks ago.  Since then his pain is returned and has been progressively worsening.  He has pain located anteriorly in both knees with stiffness in the posterior knee.  Does note consistent crepitus.   Chris Hamilton is a 48 y.o. male presents today with ongoing left knee pain.  He states that when he was 16 he was diagnosed with a meniscus tear and subsequently surgery was deferred and he did come with respiratory illness.  After that time he began experiencing some pain relief and as result the knee was not further addressed.  He does state that the knee is continue to feel unstable and painful.  He will occasionally feel pops in the knee.  He does have mechanical symptoms involving the knee.  He has not had any physical therapy.  He states that the knee continues to give out on him.  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  He is here today for further assessment.  He is also complaining of bilateral heel pain.  This is occurring on the medial aspect of the arch.  This has been going on for several months now.  He has used a frozen water bottle about of the heel to help.   Surgical History:   None  PMH/PSH/Family History/Social History/Meds/Allergies:    Past Medical History:  Diagnosis Date   Anxiety    Arthritis    Depression    Hepatitis C, chronic (HCC)    Incarcerated umbilical hernia    S/P repair by Dr. Signe 09/22/2021   IV drug abuse (HCC)    Shingles 2022   Past Surgical History:  Procedure Laterality Date   CHOLECYSTECTOMY N/A 11/16/2021   Procedure: LAPAROSCOPIC CHOLECYSTECTOMY;  Surgeon: Eletha Boas, MD;  Location: [facility];   Service: General;  Laterality: N/A;   INCISIONAL HERNIA REPAIR N/A 05/03/2022   Procedure: LAPAROSCOPIC INCISIONAL/VENTRAL HERNIA REPAIR;  Surgeon: Signe Mitzie LABOR, MD;  Location: [facility];  Service: General;  Laterality: N/A;   UMBILICAL HERNIA REPAIR N/A 09/22/2021   Procedure: OPEN UMBILICAL HERNIA REPAIR WITH MESH;  Surgeon: Signe Mitzie LABOR, MD;  Location: [facility];  Service: General;  Laterality: N/A;   XI ROBOTIC ASSISTED VENTRAL HERNIA N/A 03/08/2023   Procedure: ROBOTIC RECURRENT INCISIONAL HERNIA REPAIR WITH MESH, BILATERAL POSTERIOR RECTUS MYOFASCIAL RELEASE, BILATERAL TRANSVERSUS ABDOMINIS RELEASE, PARTIAL REMOVAL OF INTRAPERITONEAL MESH;  Surgeon: Lyndel Deward PARAS, MD;  Location: [facility];  Service: General;  Laterality: N/A;   Social History   Socioeconomic History   Marital status: Single    Spouse name: Not on file   Number of children: Not on file   Years of education: Not on file   Highest education level: 12th grade  Occupational History   Not on file  Tobacco Use   Smoking status: Every Day    Current packs/day: 0.00    Average packs/day: 1 pack/day for 20.0 years (20.0 ttl pk-yrs)    Types: Cigarettes    Start date: 2003  Last attempt to quit: 2023    Years since quitting: 2.0   Smokeless tobacco: Never   Tobacco comments:    Vapes daily. Quit cigarettes  Vaping Use   Vaping status: Every Day   Substances: Nicotine, Flavoring  Substance and Sexual Activity   Alcohol use: Not Currently   Drug use: Not Currently    Types: Methamphetamines, Heroin, Cocaine, MDMA (Ecstacy), LSD, Marijuana, Amphetamines, Oxycodone , Benzodiazepines    Comment: past meth and heroin use, currently 03/08/23 takes 30 Kratom per day   Sexual activity: Yes  Other Topics Concern   Not on file  Social History Narrative   Not on file   Social Drivers of Health   Financial Resource Strain: Low Risk  (05/25/2023)   Overall Financial Resource Strain (CARDIA)     Difficulty of Paying Living Expenses: Not very hard  Food Insecurity: Unknown (05/25/2023)   Hunger Vital Sign    Worried About Running Out of Food in the Last Year: Patient declined    Ran Out of Food in the Last Year: Never true  Transportation Needs: No Transportation Needs (05/25/2023)   PRAPARE - Administrator, Civil Service (Medical): No    Lack of Transportation (Non-Medical): No  Physical Activity: Unknown (05/25/2023)   Exercise Vital Sign    Days of Exercise per Week: Patient declined    Minutes of Exercise per Session: Not on file  Stress: No Stress Concern Present (05/25/2023)   [employer] of Occupational Health - Occupational Stress Questionnaire    Feeling of Stress : Only a little  Social Connections: Socially Isolated (05/25/2023)   Social Connection and Isolation Panel [NHANES]    Frequency of Communication with Friends and Family: More than three times a week    Frequency of Social Gatherings with Friends and Family: Patient declined    Attends Religious Services: Never    Database Administrator or Organizations: No    Attends Engineer, Structural: Not on file    Marital Status: Never married   Family History  Problem Relation Age of Onset   Cancer Mother    Miscarriages / Stillbirths Mother    Cancer Father    Diabetes Maternal Grandmother    Cancer Maternal Grandmother    Heart disease Neg Hx    No Known Allergies Current Outpatient Medications  Medication Sig Dispense Refill   albuterol  (VENTOLIN  HFA) 108 (90 Base) MCG/ACT inhaler Inhale 2 puffs into the lungs every 6 (six) hours as needed for wheezing or shortness of breath. (Patient not taking: Reported on 05/26/2023) 8 g 2   tobramycin  (TOBREX ) 0.3 % ophthalmic solution Place 1 drop into the right eye every 6 (six) hours. 5 mL 0   No current facility-administered medications for this visit.   No results found.  Review of Systems:   A ROS was performed including pertinent  positives and negatives as documented in the HPI.  Physical Exam :   Constitutional: NAD and appears stated age Neurological: Alert and oriented Psych: Appropriate affect and cooperative There were no vitals taken for this visit.   Comprehensive Musculoskeletal Exam:     Musculoskeletal Exam  Gait Normal  Alignment Normal   Right Left  Inspection Normal Normal  Palpation    Tenderness Medial joint Medial joint  Crepitus Mild Mild  Effusion None None  Range of Motion    Extension 0 0  Flexion 120 120  Strength    Extension 5/5 5/5  Flexion 5/5 5/5  Ligament Exam     Generalized Laxity No No  Valgus at 0 Negative Negative  Valgus at 20 Negative Negative  Varus at 0 0 0  Varus at 20   0 0  Vascular/Lymphatic Exam    Edema None None  Venous Stasis Changes No No  Distal Circulation Normal Normal  Neurologic    Light Touch Sensation Intact Intact  Special Tests:     Imaging:     Assessment:   48 y.o. male with bilateral knee osteoarthritis more prevalent in the medial compartments.  He has been managing this over the last 2 years with cortisone injections which are continuing to give relief however duration has diminished slightly.  Injections on 9/6 lasted just over 3 months.  Dr. Genelle had discussed the possibility of PRP injections which she is interested in today.  Discussed that we will repeat cortisone injections today and refer him to Dr. Burnetta to further discuss this.  Injections performed today without any complication and he tolerated this well.  Plan :    -Cortisone injection performed of bilateral knees today -Referral to Dr. Burnetta for discussion of PRP -Return to clinic as needed       Procedure Note  Patient: Chris Hamilton             Date of Birth: Nov 08, 1975           MRN: [id number]             Visit Date: 07/28/2023  Procedures: Visit Diagnoses:  1. Unilateral primary osteoarthritis, right knee   2. Unilateral primary  osteoarthritis, left knee     Large Joint Inj: bilateral knee on 07/28/2023 2:10 PM Indications: pain Details: 22 G 1.5 in needle, anterolateral approach Medications (Right): 4 mL lidocaine  1 %; 2 mL triamcinolone  acetonide 40 MG/ML Medications (Left): 4 mL lidocaine  1 %; 2 mL triamcinolone  acetonide 40 MG/ML Outcome: tolerated well, no immediate complications Procedure, treatment alternatives, risks and benefits explained, specific risks discussed. Consent was given by the patient. Immediately prior to procedure a time out was called to verify the correct patient, procedure, equipment, support staff and site/side marked as required. Patient was prepped and draped in the usual sterile fashion.      I personally saw and evaluated the patient, and participated in the management and treatment plan.   Leonce Reveal, PA-C Orthopedics

## 2023-08-04 ENCOUNTER — Encounter (HOSPITAL_BASED_OUTPATIENT_CLINIC_OR_DEPARTMENT_OTHER): Payer: Self-pay

## 2023-08-04 ENCOUNTER — Ambulatory Visit (HOSPITAL_BASED_OUTPATIENT_CLINIC_OR_DEPARTMENT_OTHER): Payer: BC Managed Care – PPO | Admitting: Orthopaedic Surgery

## 2023-08-15 ENCOUNTER — Ambulatory Visit: Payer: BC Managed Care – PPO | Admitting: Sports Medicine

## 2023-08-15 ENCOUNTER — Encounter: Payer: Self-pay | Admitting: Sports Medicine

## 2023-08-15 DIAGNOSIS — M25561 Pain in right knee: Secondary | ICD-10-CM

## 2023-08-15 DIAGNOSIS — M170 Bilateral primary osteoarthritis of knee: Secondary | ICD-10-CM

## 2023-08-15 DIAGNOSIS — G8929 Other chronic pain: Secondary | ICD-10-CM

## 2023-08-15 DIAGNOSIS — M1712 Unilateral primary osteoarthritis, left knee: Secondary | ICD-10-CM

## 2023-08-15 DIAGNOSIS — M1711 Unilateral primary osteoarthritis, right knee: Secondary | ICD-10-CM

## 2023-08-15 DIAGNOSIS — M25562 Pain in left knee: Secondary | ICD-10-CM | POA: Diagnosis not present

## 2023-08-15 NOTE — Progress Notes (Signed)
Chris Hamilton - 48 y.o. male MRN [id number]  Date of birth: [birth year]  Office Visit Note: Visit Date: 08/15/2023 PCP: Olive Bass, FNP Referred by: Amador Cunas, PA*  Subjective: Chief Complaint  Patient presents with   Right Knee - Pain   Left Knee - Pain   HPI: BURON Hamilton is a pleasant 48 y.o. male who presents today for follow-up of bilateral knee osteoarthritis with discussion on PRP injection therapy.  He has had chronic pain in both knees, historically his left knee has always been worse as he had a meniscus tear when he was younger which surgery was deferred at that time.  Currently his right knee is bothering him slightly more.  He does have popping and mechanical symptoms about the knees.  Here recently he was seen by Hazle Nordmann on 07/27/2023 and did have bilateral corticosteroid injections which he states helped excellent and further improved his pain from prior injections.  He is being smart about joint preservation and does not wish to have recurrent corticosteroids, hence asking about PRP injection.  He does vape, but he does not smoke tobacco products.  Pertinent ROS were reviewed with the patient and found to be negative unless otherwise specified above in HPI.   Assessment & Plan: Visit Diagnoses:  1. Bilateral chronic knee pain   2. Unilateral primary osteoarthritis, right knee   3. Unilateral primary osteoarthritis, left knee    Plan: Chris Hamilton has L > R osteoarthritis with also concomitant meniscal tearing of the left knee.  He has received good results from corticosteroid injection most recently but is working on joint preservation and so we discussed PRP injection therapy today.  I do think he would be a candidate for both knees, although I believe the right knee has a higher chance of being advantageous given the high degree of cartilage loss and advanced arthritis of the contralateral left knee.  Advised him he would need to  wait about 1 month from his knee corticosteroid injection before proceeding with PRP injection therapy.  We discussed returning to work, stopping anti-inflammatories and his postinjection return/protocol.  All questions were answered, he will take time to think about this and will let me know if he would like to move forward with therapy.   Follow-up: Return for May schedule Knee PRP anytime starting 08/25/23 and on .   Meds & Orders: No orders of the defined types were placed in this encounter.  No orders of the defined types were placed in this encounter.    Procedures: No procedures performed      Clinical History: No specialty comments available.  He reports that he has been smoking cigarettes. He started smoking about 22 years ago. He has a 20 pack-year smoking history. He has never used smokeless tobacco. No results for input(s): "HGBA1C", "LABURIC" in the last 8760 hours.  Objective:    Physical Exam  Gen: Well-appearing, in no acute distress; non-toxic CV: Well-perfused. Warm.  Resp: Breathing unlabored on room air; no wheezing. Psych: Fluid speech in conversation; appropriate affect; normal thought process  Ortho Exam - Bilateral knee: Nonantalgic gait, there is no gross effusions of either knee.   Imaging:  *I did independently review and interpret both his left knee MRI from 01/11/2022 and his right knee x-ray from 09/21/2022 today. Agree with radiology read.  Narrative & Impression  CLINICAL DATA:  Pain   EXAM: RIGHT KNEE - COMPLETE 4+ VIEW   COMPARISON:  None Available.  FINDINGS: Medial compartment degenerative changes with small osteophytes. No other degenerative change. No fracture, dislocation, bony lesion, soft tissue abnormality, or effusion.   IMPRESSION: Medial compartment degenerative changes with small osteophytes.     Electronically Signed   By: Gerome Sam III M.D.   On: 09/22/2022 17:06     Narrative & Impression  CLINICAL DATA:   Chronic left knee pain.   EXAM: MRI OF THE LEFT KNEE WITHOUT CONTRAST   TECHNIQUE: Multiplanar, multisequence MR imaging of the left was performed. No intravenous contrast was administered.   COMPARISON:  Radiographs dated December 24, 2021   FINDINGS: MENISCI   Medial: Complex tear of the body/posterior horn of the medial meniscus with mild meniscal extrusion.   Lateral: Intact.   LIGAMENTS   Cruciates: ACL and PCL are intact.   Collaterals: Thickening of the femoral attachment medial collateral ligament with mild edema concerning for chronic sprain. Lateral collateral ligament complex is intact.   CARTILAGE   Patellofemoral: Focal full-thickness cartilage defect at the patellar apex with subchondral cyst. Deep fissuring of the trochlear articular cartilage with near complete loss of the articular cartilage in the medial trochlea.   Medial: Full-thickness cartilage loss with subchondral edema of the femoral condyle and medial tibial plateau.   Lateral:  No chondral defect.   JOINT: Moderate joint effusion. Normal Hoffa's fat-pad. No plical thickening.   POPLITEAL FOSSA: Popliteus tendon is intact. Small Baker's cyst measuring approximately 1.6 x 1.6 x 3.1 cm.   EXTENSOR MECHANISM: Intact quadriceps tendon. Intact patellar tendon. Intact lateral patellar retinaculum. Intact medial patellar retinaculum. Intact MPFL.   BONES: No aggressive osseous lesion. No fracture or dislocation. Moderate tricompartmental marginal osteophytes prominent in the medial tibiofemoral compartment.   Other: No fluid collection or hematoma. Muscles are normal.   IMPRESSION: 1. Moderate medial tibiofemoral osteoarthritis with complex degenerative tear of the medial meniscus, full-thickness cartilage defect and subchondral edema of the medial femoral condyle and medial tibial plateau with extruded meniscus.   2.  Mild patellofemoral osteoarthritis.   3.  Moderate joint effusion.    4.  Baker's cyst measuring approximately 1.6 x 1.6 x 3.1 cm.   5. Thickening of the medial collateral ligament with mild surrounding edema concerning for ligamentous sprain, likely subacute or chronic process.     Electronically Signed   By: Larose Hires D.O.   On: 01/11/2022 23:51     Past Medical/Family/Surgical/Social History: Medications & Allergies reviewed per EMR, new medications updated. Patient Active Problem List   Diagnosis Date Noted   Recurrent incisional hernia 03/08/2023   Depression 12/23/2021   Status post umbilical hernia repair, follow-up exam 12/23/2021   Chronic hepatitis C without hepatic coma (HCC) 12/22/2021   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/15/2021   Obesity (BMI 30-39.9) 11/15/2021   Status post laparoscopic cholecystectomy 11/14/2021   Past Medical History:  Diagnosis Date   Anxiety    Arthritis    Depression    Hepatitis C, chronic (HCC)    Incarcerated umbilical hernia    S/P repair by Dr. Fredricka Bonine 09/22/2021   IV drug abuse (HCC)    Shingles 2022   Family History  Problem Relation Age of Onset   Cancer Mother    Miscarriages / [place] Mother    Cancer Father    Diabetes Maternal Grandmother    Cancer Maternal Grandmother    Heart disease Neg Hx    Past Surgical History:  Procedure Laterality Date   CHOLECYSTECTOMY N/A 11/16/2021  Procedure: LAPAROSCOPIC CHOLECYSTECTOMY;  Surgeon: Darnell Level, MD;  Location: [facility];  Service: General;  Laterality: N/A;   INCISIONAL HERNIA REPAIR N/A 05/03/2022   Procedure: LAPAROSCOPIC INCISIONAL/VENTRAL HERNIA REPAIR;  Surgeon: Berna Bue, MD;  Location: [facility];  Service: General;  Laterality: N/A;   UMBILICAL HERNIA REPAIR N/A 09/22/2021   Procedure: OPEN UMBILICAL HERNIA REPAIR WITH MESH;  Surgeon: Berna Bue, MD;  Location: [facility] [place];  Service: General;  Laterality: N/A;   XI ROBOTIC ASSISTED VENTRAL HERNIA N/A 03/08/2023   Procedure: ROBOTIC RECURRENT  INCISIONAL HERNIA REPAIR WITH MESH, BILATERAL POSTERIOR RECTUS MYOFASCIAL RELEASE, BILATERAL TRANSVERSUS ABDOMINIS RELEASE, PARTIAL REMOVAL OF INTRAPERITONEAL MESH;  Surgeon: Quentin Ore, MD;  Location: [facility];  Service: General;  Laterality: N/A;   Social History   Occupational History   Not on file  Tobacco Use   Smoking status: Every Day    Current packs/day: 0.00    Average packs/day: 1 pack/day for 20.0 years (20.0 ttl pk-yrs)    Types: Cigarettes    Start date: 2003    Last attempt to quit: 2023    Years since quitting: 2.0   Smokeless tobacco: Never   Tobacco comments:    Vapes daily. Quit cigarettes  Vaping Use   Vaping status: Every Day   Substances: Nicotine, Flavoring  Substance and Sexual Activity   Alcohol use: Not Currently   Drug use: Not Currently    Types: Methamphetamines, Heroin, Cocaine, MDMA (Ecstacy), LSD, Marijuana, Amphetamines, Oxycodone, Benzodiazepines    Comment: past meth and heroin use, currently 03/08/23 takes 30 Kratom per day   Sexual activity: Yes

## 2023-08-15 NOTE — Progress Notes (Signed)
Patient says that he had repeat injections into the knees at the beginning of January and this seems to be the best his knees have felt. He is here to discuss PRP injections.

## 2023-10-26 ENCOUNTER — Ambulatory Visit (HOSPITAL_BASED_OUTPATIENT_CLINIC_OR_DEPARTMENT_OTHER): Admitting: Orthopaedic Surgery

## 2023-10-26 DIAGNOSIS — M1711 Unilateral primary osteoarthritis, right knee: Secondary | ICD-10-CM | POA: Diagnosis not present

## 2023-10-26 DIAGNOSIS — M170 Bilateral primary osteoarthritis of knee: Secondary | ICD-10-CM

## 2023-10-26 DIAGNOSIS — M1712 Unilateral primary osteoarthritis, left knee: Secondary | ICD-10-CM

## 2023-10-26 DIAGNOSIS — M25562 Pain in left knee: Secondary | ICD-10-CM

## 2023-10-26 DIAGNOSIS — M25561 Pain in right knee: Secondary | ICD-10-CM

## 2023-10-26 MED ORDER — TRIAMCINOLONE ACETONIDE 40 MG/ML IJ SUSP
80.0000 mg | INTRAMUSCULAR | Status: AC | PRN
Start: 2023-10-26 — End: 2023-10-26
  Administered 2023-10-26: 80 mg via INTRA_ARTICULAR

## 2023-10-26 MED ORDER — LIDOCAINE HCL 1 % IJ SOLN
4.0000 mL | INTRAMUSCULAR | Status: AC | PRN
Start: 2023-10-26 — End: 2023-10-26
  Administered 2023-10-26: 4 mL

## 2023-10-26 NOTE — Progress Notes (Signed)
 Chief Complaint: Left knee pain, right knee pain     History of Present Illness:   10/26/2023: Presents today for follow-up of bilateral knees  Chris Hamilton is a 48 y.o. male presents today with ongoing left knee pain.  He states that when he was 16 he was diagnosed with a meniscus tear and subsequently surgery was deferred and he did come with respiratory illness.  After that time he began experiencing some pain relief and as result the knee was not further addressed.  He does state that the knee is continue to feel unstable and painful.  He will occasionally feel pops in the knee.  He does have mechanical symptoms involving the knee.  He has not had any physical therapy.  He states that the knee continues to give out on him.  He works waiting tables locally here in [place] and does not feel stable with regard to this knee.  He is here today for further assessment.  He is also complaining of bilateral heel pain.  This is occurring on the medial aspect of the arch.  This has been going on for several months now.  He has used a frozen water bottle about of the heel to help.    Surgical History:   None  PMH/PSH/Family History/Social History/Meds/Allergies:    Past Medical History:  Diagnosis Date  . Anxiety   . Arthritis   . Depression   . Hepatitis C, chronic (HCC)   . Incarcerated umbilical hernia    S/P repair by Dr. Fredricka Bonine 09/22/2021  . IV drug abuse (HCC)   . Shingles 2022   Past Surgical History:  Procedure Laterality Date  . CHOLECYSTECTOMY N/A 11/16/2021   Procedure: LAPAROSCOPIC CHOLECYSTECTOMY;  Surgeon: Darnell Level, MD;  Location: [facility];  Service: General;  Laterality: N/A;  . INCISIONAL HERNIA REPAIR N/A 05/03/2022   Procedure: LAPAROSCOPIC INCISIONAL/VENTRAL HERNIA REPAIR;  Surgeon: Berna Bue, MD;  Location: [facility];  Service: General;  Laterality: N/A;  . UMBILICAL HERNIA REPAIR N/A 09/22/2021   Procedure: OPEN  UMBILICAL HERNIA REPAIR WITH MESH;  Surgeon: Berna Bue, MD;  Location: [facility] [place];  Service: General;  Laterality: N/A;  . XI ROBOTIC ASSISTED VENTRAL HERNIA N/A 03/08/2023   Procedure: ROBOTIC RECURRENT INCISIONAL HERNIA REPAIR WITH MESH, BILATERAL POSTERIOR RECTUS MYOFASCIAL RELEASE, BILATERAL TRANSVERSUS ABDOMINIS RELEASE, PARTIAL REMOVAL OF INTRAPERITONEAL MESH;  Surgeon: Quentin Ore, MD;  Location: [facility];  Service: General;  Laterality: N/A;   Social History   Socioeconomic History  . Marital status: Single    Spouse name: Not on file  . Number of children: Not on file  . Years of education: Not on file  . Highest education level: 12th grade  Occupational History  . Not on file  Tobacco Use  . Smoking status: Every Day    Current packs/day: 0.00    Average packs/day: 1 pack/day for 20.0 years (20.0 ttl pk-yrs)    Types: Cigarettes    Start date: 2003    Last attempt to quit: 2023    Years since quitting: 2.2  . Smokeless tobacco: Never  . Tobacco comments:    Vapes daily. Quit cigarettes  Vaping Use  . Vaping status: Every Day  . Substances: Nicotine, Flavoring  Substance and Sexual Activity  . Alcohol use:  Not Currently  . Drug use: Not Currently    Types: Methamphetamines, Heroin, Cocaine, MDMA (Ecstacy), LSD, Marijuana, Amphetamines, Oxycodone, Benzodiazepines    Comment: past meth and heroin use, currently 03/08/23 takes 30 Kratom per day  . Sexual activity: Yes  Other Topics Concern  . Not on file  Social History Narrative  . Not on file   Social Drivers of Health   Financial Resource Strain: Low Risk  (05/25/2023)   Overall Financial Resource Strain (CARDIA)   . Difficulty of Paying Living Expenses: Not very hard  Food Insecurity: Unknown (05/25/2023)   Hunger Vital Sign   . Worried About Programme researcher, broadcasting/film/video in the Last Year: Patient declined   . Ran Out of Food in the Last Year: Never true  Transportation Needs: No  Transportation Needs (05/25/2023)   PRAPARE - Transportation   . Lack of Transportation (Medical): No   . Lack of Transportation (Non-Medical): No  Physical Activity: Unknown (05/25/2023)   Exercise Vital Sign   . Days of Exercise per Week: Patient declined   . Minutes of Exercise per Session: Not on file  Stress: No Stress Concern Present (05/25/2023)   [employer] of Occupational Health - Occupational Stress Questionnaire   . Feeling of Stress : Only a little  Social Connections: Socially Isolated (05/25/2023)   Social Connection and Isolation Panel [NHANES]   . Frequency of Communication with Friends and Family: More than three times a week   . Frequency of Social Gatherings with Friends and Family: Patient declined   . Attends Religious Services: Never   . Active Member of Clubs or Organizations: No   . Attends Banker Meetings: Not on file   . Marital Status: Never married   Family History  Problem Relation Age of Onset  . Cancer Mother   . Miscarriages / [place] Mother   . Cancer Father   . Diabetes Maternal Grandmother   . Cancer Maternal Grandmother   . Heart disease Neg Hx    No Known Allergies Current Outpatient Medications  Medication Sig Dispense Refill  . albuterol (VENTOLIN HFA) 108 (90 Base) MCG/ACT inhaler Inhale 2 puffs into the lungs every 6 (six) hours as needed for wheezing or shortness of breath. (Patient not taking: Reported on 05/26/2023) 8 g 2  . tobramycin (TOBREX) 0.3 % ophthalmic solution Place 1 drop into the right eye every 6 (six) hours. 5 mL 0   No current facility-administered medications for this visit.   No results found.  Review of Systems:   A ROS was performed including pertinent positives and negatives as documented in the HPI.  Physical Exam :   Constitutional: NAD and appears stated age Neurological: Alert and oriented Psych: Appropriate affect and cooperative There were no vitals taken for this visit.    Comprehensive Musculoskeletal Exam:      Musculoskeletal Exam  Gait Normal  Alignment Normal   Right Left  Inspection Normal Normal  Palpation    Tenderness Medial joint Medial joint  Crepitus None None  Effusion None None  Range of Motion    Extension 0 0  Flexion 135 135  Strength    Extension 5/5 5/5  Flexion 5/5 5/5  Ligament Exam     Generalized Laxity No No  Lachman Negative Negative   Pivot Shift Negative Negative  Anterior Drawer Negative Negative  Valgus at 0 Negative Negative  Valgus at 20 Negative Negative  Varus at 0 0 0  Varus at 20  0 0  Posterior Drawer at 90 0 0  Vascular/Lymphatic Exam    Edema None None  Venous Stasis Changes No No  Distal Circulation Normal Normal  Neurologic    Light Touch Sensation Intact Intact  Special Tests:      Imaging:   Xray (4 views left knee, 4 views right knee): Normal  MRI left knee: He has significant medial meniscal tearing and extrusion with end-stage arthritis of the medial joint space.  There is evidence of an old PCL injury as well  I personally reviewed and interpreted the radiographs.   Assessment:   48 y.o. 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  I did describe that unfortunately his left worse than right these do have evidence of early arthritis.  This time he would like to proceed with ultrasound-guided injections of both knees for hopefully longer lasting relief.  Will plan to proceed with this.  I will plan to see him back as needed Plan :    -Bilateral ultrasound-guided knee injections performed after verbal consent obtained     Procedure Note  Patient: Chris Hamilton             Date of Birth: 04/04/1976           MRN: [id number]             Visit Date: 10/26/2023  Procedures: Visit Diagnoses: No diagnosis found.  Large Joint Inj: R knee on 10/26/2023 11:27 AM Indications: pain Details: 22 G 1.5 in needle,  ultrasound-guided anterior approach  Arthrogram: No  Medications: 4 mL lidocaine 1 %; 80 mg triamcinolone acetonide 40 MG/ML Outcome: tolerated well, no immediate complications Procedure, treatment alternatives, risks and benefits explained, specific risks discussed. Consent was given by the patient. Immediately prior to procedure a time out was called to verify the correct patient, procedure, equipment, support staff and site/side marked as required. Patient was prepped and draped in the usual sterile fashion.    Large Joint Inj: L knee on 10/26/2023 11:28 AM Indications: pain Details: 22 G 1.5 in needle, ultrasound-guided anterior approach  Arthrogram: No  Medications: 4 mL lidocaine 1 %; 80 mg triamcinolone acetonide 40 MG/ML Outcome: tolerated well, no immediate complications Procedure, treatment alternatives, risks and benefits explained, specific risks discussed. Consent was given by the patient. Immediately prior to procedure a time out was called to verify the correct patient, procedure, equipment, support staff and site/side marked as required. Patient was prepped and draped in the usual sterile fashion.         I personally saw and evaluated the patient, and participated in the management and treatment plan.  Huel Cote, MD Attending Physician, Orthopedic Surgery  This document was dictated using Dragon voice recognition software. A reasonable attempt at proof reading has been made to minimize errors.

## 2023-12-05 ENCOUNTER — Ambulatory Visit: Admitting: Sports Medicine

## 2024-01-10 ENCOUNTER — Encounter (HOSPITAL_BASED_OUTPATIENT_CLINIC_OR_DEPARTMENT_OTHER): Payer: Self-pay

## 2024-01-25 ENCOUNTER — Encounter (HOSPITAL_BASED_OUTPATIENT_CLINIC_OR_DEPARTMENT_OTHER): Payer: Self-pay | Admitting: Student

## 2024-01-25 ENCOUNTER — Ambulatory Visit (HOSPITAL_BASED_OUTPATIENT_CLINIC_OR_DEPARTMENT_OTHER): Admitting: Student

## 2024-01-25 DIAGNOSIS — M170 Bilateral primary osteoarthritis of knee: Secondary | ICD-10-CM | POA: Diagnosis not present

## 2024-01-25 DIAGNOSIS — M25562 Pain in left knee: Secondary | ICD-10-CM | POA: Diagnosis not present

## 2024-01-25 DIAGNOSIS — M25561 Pain in right knee: Secondary | ICD-10-CM

## 2024-01-25 DIAGNOSIS — M1712 Unilateral primary osteoarthritis, left knee: Secondary | ICD-10-CM | POA: Diagnosis not present

## 2024-01-25 DIAGNOSIS — G8929 Other chronic pain: Secondary | ICD-10-CM

## 2024-01-25 DIAGNOSIS — M1711 Unilateral primary osteoarthritis, right knee: Secondary | ICD-10-CM | POA: Diagnosis not present

## 2024-01-25 MED ORDER — LIDOCAINE HCL 1 % IJ SOLN
4.0000 mL | INTRAMUSCULAR | Status: AC | PRN
Start: 2024-01-25 — End: 2024-01-25
  Administered 2024-01-25: 4 mL

## 2024-01-25 MED ORDER — TRIAMCINOLONE ACETONIDE 40 MG/ML IJ SUSP
2.0000 mL | INTRAMUSCULAR | Status: AC | PRN
Start: 2024-01-25 — End: 2024-01-25
  Administered 2024-01-25: 2 mL via INTRA_ARTICULAR

## 2024-01-25 NOTE — Progress Notes (Signed)
 Chief Complaint: Bilateral knee pain     History of Present Illness:   Chris Hamilton is a 48 y.o. male who presents today for follow-up evaluation of bilateral knee pain.  He last received cortisone injections 3 months ago in both knees which did not quite give him the same relief as the prior injections.  Pain continues to affect his ability to go up and down stairs and walk or stand for long periods of time.  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  Today his right knee is bothering him slightly more than the left, however the majority of his pain frequently changes between both knees.  Was seen by Dr. Burnetta in January at which time PRP injections were discussed, and he still may consider this in the future.   Surgical History:   None  PMH/PSH/Family History/Social History/Meds/Allergies:    Past Medical History:  Diagnosis Date   Anxiety    Arthritis    Depression    Hepatitis C, chronic (HCC)    Incarcerated umbilical hernia    S/P repair by Dr. Signe 09/22/2021   IV drug abuse (HCC)    Shingles 2022   Past Surgical History:  Procedure Laterality Date   CHOLECYSTECTOMY N/A 11/16/2021   Procedure: LAPAROSCOPIC CHOLECYSTECTOMY;  Surgeon: Eletha Boas, MD;  Location: [facility];  Service: General;  Laterality: N/A;   INCISIONAL HERNIA REPAIR N/A 05/03/2022   Procedure: LAPAROSCOPIC INCISIONAL/VENTRAL HERNIA REPAIR;  Surgeon: Signe Mitzie LABOR, MD;  Location: [facility];  Service: General;  Laterality: N/A;   UMBILICAL HERNIA REPAIR N/A 09/22/2021   Procedure: OPEN UMBILICAL HERNIA REPAIR WITH MESH;  Surgeon: Signe Mitzie LABOR, MD;  Location: [facility] [place];  Service: General;  Laterality: N/A;   XI ROBOTIC ASSISTED VENTRAL HERNIA N/A 03/08/2023   Procedure: ROBOTIC RECURRENT INCISIONAL HERNIA REPAIR WITH MESH, BILATERAL POSTERIOR RECTUS MYOFASCIAL RELEASE, BILATERAL TRANSVERSUS ABDOMINIS RELEASE, PARTIAL REMOVAL OF  INTRAPERITONEAL MESH;  Surgeon: Lyndel Deward PARAS, MD;  Location: [facility];  Service: General;  Laterality: N/A;   Social History   Socioeconomic History   Marital status: Single    Spouse name: Not on file   Number of children: Not on file   Years of education: Not on file   Highest education level: 12th grade  Occupational History   Not on file  Tobacco Use   Smoking status: Every Day    Current packs/day: 0.00    Average packs/day: 1 pack/day for 20.0 years (20.0 ttl pk-yrs)    Types: Cigarettes    Start date: 2003    Last attempt to quit: 2023    Years since quitting: 2.5   Smokeless tobacco: Never   Tobacco comments:    Vapes daily. Quit cigarettes  Vaping Use   Vaping status: Every Day   Substances: Nicotine, Flavoring  Substance and Sexual Activity   Alcohol use: Not Currently   Drug use: Not Currently    Types: Methamphetamines, Heroin, Cocaine, MDMA (Ecstacy), LSD, Marijuana, Amphetamines, Oxycodone , Benzodiazepines    Comment: past meth and heroin use, currently 03/08/23 takes 30 Kratom per day   Sexual activity: Yes  Other Topics Concern   Not on file  Social History Narrative   Not on file   Social Drivers of Health   Financial Resource Strain: Low  Risk  (05/25/2023)   Overall Financial Resource Strain (CARDIA)    Difficulty of Paying Living Expenses: Not very hard  Food Insecurity: Unknown (05/25/2023)   Hunger Vital Sign    Worried About Running Out of Food in the Last Year: Patient declined    Ran Out of Food in the Last Year: Never true  Transportation Needs: No Transportation Needs (05/25/2023)   PRAPARE - Administrator, Civil Service (Medical): No    Lack of Transportation (Non-Medical): No  Physical Activity: Unknown (05/25/2023)   Exercise Vital Sign    Days of Exercise per Week: Patient declined    Minutes of Exercise per Session: Not on file  Stress: No Stress Concern Present (05/25/2023)   [employer] of Occupational  Health - Occupational Stress Questionnaire    Feeling of Stress : Only a little  Social Connections: Socially Isolated (05/25/2023)   Social Connection and Isolation Panel    Frequency of Communication with Friends and Family: More than three times a week    Frequency of Social Gatherings with Friends and Family: Patient declined    Attends Religious Services: Never    Database administrator or Organizations: No    Attends Engineer, structural: Not on file    Marital Status: Never married   Family History  Problem Relation Age of Onset   Cancer Mother    Miscarriages / Stillbirths Mother    Cancer Father    Diabetes Maternal Grandmother    Cancer Maternal Grandmother    Heart disease Neg Hx    No Known Allergies Current Outpatient Medications  Medication Sig Dispense Refill   albuterol  (VENTOLIN  HFA) 108 (90 Base) MCG/ACT inhaler Inhale 2 puffs into the lungs every 6 (six) hours as needed for wheezing or shortness of breath. (Patient not taking: Reported on 05/26/2023) 8 g 2   tobramycin  (TOBREX ) 0.3 % ophthalmic solution Place 1 drop into the right eye every 6 (six) hours. 5 mL 0   No current facility-administered medications for this visit.   No results found.  Review of Systems:   A ROS was performed including pertinent positives and negatives as documented in the HPI.  Physical Exam :   Constitutional: NAD and appears stated age Neurological: Alert and oriented Psych: Appropriate affect and cooperative There were no vitals taken for this visit.   Comprehensive Musculoskeletal Exam:    Exam of bilateral knees demonstrates tenderness particular over the medial joint lines.  Active range of motion is from 5 to 120 degrees with minimal to mild crepitus.  Stable collaterals with varus and valgus stress at 0 and 30 degrees.  No overlying erythema or warmth and minimal effusions are present.  Imaging:     Assessment:   48 y.o. male with bilateral knee  osteoarthritis, most prevalent in the medial compartments and is radiographically more advanced in the left knee.  He has been managing symptoms with cortisone injections which have been giving him good relief and has previously discussed a trial of PRP with Dr. Burnetta but has not proceeded with this.  Current symptoms are affecting his activities of daily living and ability to perform his job duties.  Given this I am agreeable to repeating injections today as it has been 3 months since last round of injections.  These were performed today into both knees which he tolerated very well.  Can plan to see him back as needed.  Plan :    - Bilateral knee cortisone  injections performed today - Return to clinic as needed       Procedure Note  Patient: Chris Hamilton             Date of Birth: [birth year]           MRN: [id number]             Visit Date: 01/25/2024  Procedures: Visit Diagnoses:  1. Unilateral primary osteoarthritis, right knee   2. Unilateral primary osteoarthritis, left knee   3. Bilateral chronic knee pain      Large Joint Inj: bilateral knee on 01/25/2024 11:53 AM Indications: pain Details: 22 G 1.5 in needle, anterolateral approach Medications (Right): 4 mL lidocaine  1 %; 2 mL triamcinolone  acetonide 40 MG/ML Medications (Left): 4 mL lidocaine  1 %; 2 mL triamcinolone  acetonide 40 MG/ML Outcome: tolerated well, no immediate complications Procedure, treatment alternatives, risks and benefits explained, specific risks discussed. Consent was given by the patient. Immediately prior to procedure a time out was called to verify the correct patient, procedure, equipment, support staff and site/side marked as required. Patient was prepped and draped in the usual sterile fashion.      I personally saw and evaluated the patient, and participated in the management and treatment plan.   Leonce Reveal, PA-C Orthopedics

## 2024-02-07 ENCOUNTER — Encounter: Payer: Self-pay | Admitting: Family

## 2024-04-26 ENCOUNTER — Ambulatory Visit (INDEPENDENT_AMBULATORY_CARE_PROVIDER_SITE_OTHER): Admitting: Student

## 2024-04-26 DIAGNOSIS — M170 Bilateral primary osteoarthritis of knee: Secondary | ICD-10-CM | POA: Diagnosis not present

## 2024-04-26 DIAGNOSIS — M25562 Pain in left knee: Secondary | ICD-10-CM | POA: Diagnosis not present

## 2024-04-26 DIAGNOSIS — M1712 Unilateral primary osteoarthritis, left knee: Secondary | ICD-10-CM

## 2024-04-26 DIAGNOSIS — M1711 Unilateral primary osteoarthritis, right knee: Secondary | ICD-10-CM | POA: Diagnosis not present

## 2024-04-26 DIAGNOSIS — G8929 Other chronic pain: Secondary | ICD-10-CM

## 2024-04-26 DIAGNOSIS — M25561 Pain in right knee: Secondary | ICD-10-CM

## 2024-04-26 MED ORDER — TRIAMCINOLONE ACETONIDE 40 MG/ML IJ SUSP
2.0000 mL | INTRAMUSCULAR | Status: AC | PRN
Start: 2024-04-26 — End: 2024-04-26
  Administered 2024-04-26: 2 mL via INTRA_ARTICULAR

## 2024-04-26 MED ORDER — TRIAMCINOLONE ACETONIDE 40 MG/ML IJ SUSP
2.0000 mL | INTRAMUSCULAR | Status: AC | PRN
  Administered 2024-04-26: 2 mL via INTRA_ARTICULAR

## 2024-04-26 MED ORDER — LIDOCAINE HCL 1 % IJ SOLN
4.0000 mL | INTRAMUSCULAR | Status: AC | PRN
Start: 2024-04-26 — End: 2024-04-26
  Administered 2024-04-26: 4 mL

## 2024-04-26 NOTE — Progress Notes (Signed)
 Chief Complaint: Bilateral knee pain     History of Present Illness:   Chris Hamilton is a 48 y.o. male who presents today for follow-up of his bilateral knees.  Patient has moderate osteoarthritis in both knees and has been managing this mainly with cortisone injections which have been lasting for about 3 months.  Currently his pain is beginning to recur and affects him most particularly at his job working at [employer] which requires him to be on his feet.  He did previously discussed PRP injections with Dr. Burnetta but would like to repeat cortisone injections today.  Surgical History:   None  PMH/PSH/Family History/Social History/Meds/Allergies:    Past Medical History:  Diagnosis Date   Anxiety    Arthritis    Depression    Hepatitis C, chronic (HCC)    Incarcerated umbilical hernia    S/P repair by Dr. Signe 09/22/2021   IV drug abuse (HCC)    Shingles 2022   Past Surgical History:  Procedure Laterality Date   CHOLECYSTECTOMY N/A 11/16/2021   Procedure: LAPAROSCOPIC CHOLECYSTECTOMY;  Surgeon: Eletha Boas, MD;  Location: [facility];  Service: General;  Laterality: N/A;   INCISIONAL HERNIA REPAIR N/A 05/03/2022   Procedure: LAPAROSCOPIC INCISIONAL/VENTRAL HERNIA REPAIR;  Surgeon: Signe Mitzie LABOR, MD;  Location: [facility];  Service: General;  Laterality: N/A;   UMBILICAL HERNIA REPAIR N/A 09/22/2021   Procedure: OPEN UMBILICAL HERNIA REPAIR WITH MESH;  Surgeon: Signe Mitzie LABOR, MD;  Location: [facility] [place];  Service: General;  Laterality: N/A;   XI ROBOTIC ASSISTED VENTRAL HERNIA N/A 03/08/2023   Procedure: ROBOTIC RECURRENT INCISIONAL HERNIA REPAIR WITH MESH, BILATERAL POSTERIOR RECTUS MYOFASCIAL RELEASE, BILATERAL TRANSVERSUS ABDOMINIS RELEASE, PARTIAL REMOVAL OF INTRAPERITONEAL MESH;  Surgeon: Lyndel Deward PARAS, MD;  Location: [facility];  Service: General;  Laterality: N/A;   Social History   Socioeconomic History    Marital status: Single    Spouse name: Not on file   Number of children: Not on file   Years of education: Not on file   Highest education level: 12th grade  Occupational History   Not on file  Tobacco Use   Smoking status: Every Day    Current packs/day: 0.00    Average packs/day: 1 pack/day for 20.0 years (20.0 ttl pk-yrs)    Types: Cigarettes    Start date: 2003    Last attempt to quit: 2023    Years since quitting: 2.7   Smokeless tobacco: Never   Tobacco comments:    Vapes daily. Quit cigarettes  Vaping Use   Vaping status: Every Day   Substances: Nicotine, Flavoring  Substance and Sexual Activity   Alcohol use: Not Currently   Drug use: Not Currently    Types: Methamphetamines, Heroin, Cocaine, MDMA (Ecstacy), LSD, Marijuana, Amphetamines, Oxycodone , Benzodiazepines    Comment: past meth and heroin use, currently 03/08/23 takes 30 Kratom per day   Sexual activity: Yes  Other Topics Concern   Not on file  Social History Narrative   Not on file   Social Drivers of Health   Financial Resource Strain: Low Risk  (05/25/2023)   Overall Financial Resource Strain (CARDIA)    Difficulty of Paying Living Expenses: Not very hard  Food Insecurity: Unknown (05/25/2023)   Hunger Vital Sign    Worried About Running  Out of Food in the Last Year: Patient declined    Ran Out of Food in the Last Year: Never true  Transportation Needs: No Transportation Needs (05/25/2023)   PRAPARE - Administrator, Civil Service (Medical): No    Lack of Transportation (Non-Medical): No  Physical Activity: Unknown (05/25/2023)   Exercise Vital Sign    Days of Exercise per Week: Patient declined    Minutes of Exercise per Session: Not on file  Stress: No Stress Concern Present (05/25/2023)   [employer] of Occupational Health - Occupational Stress Questionnaire    Feeling of Stress : Only a little  Social Connections: Socially Isolated (05/25/2023)   Social Connection and  Isolation Panel    Frequency of Communication with Friends and Family: More than three times a week    Frequency of Social Gatherings with Friends and Family: Patient declined    Attends Religious Services: Never    Database administrator or Organizations: No    Attends Engineer, structural: Not on file    Marital Status: Never married   Family History  Problem Relation Age of Onset   Cancer Mother    Miscarriages / Stillbirths Mother    Cancer Father    Diabetes Maternal Grandmother    Cancer Maternal Grandmother    Heart disease Neg Hx    No Known Allergies Current Outpatient Medications  Medication Sig Dispense Refill   albuterol  (VENTOLIN  HFA) 108 (90 Base) MCG/ACT inhaler Inhale 2 puffs into the lungs every 6 (six) hours as needed for wheezing or shortness of breath. (Patient not taking: Reported on 05/26/2023) 8 g 2   tobramycin  (TOBREX ) 0.3 % ophthalmic solution Place 1 drop into the right eye every 6 (six) hours. 5 mL 0   No current facility-administered medications for this visit.   No results found.  Review of Systems:   A ROS was performed including pertinent positives and negatives as documented in the HPI.  Physical Exam :   Constitutional: NAD and appears stated age Neurological: Alert and oriented Psych: Appropriate affect and cooperative There were no vitals taken for this visit.   Comprehensive Musculoskeletal Exam:    Active bilateral knee range of motion from 0 to 120 degrees with mild crepitus.  No overlying erythema or warmth.  Tenderness over the medial joint lines of both knees.  No laxity with varus or valgus stress.  Imaging:     Assessment:   48 y.o. male with bilateral knee osteoarthritis which is prominent in the medial compartments.  X-ray and MRI of the left knee from 2023 show at least moderate changes within the medial compartment.  He has been managing this with cortisone injections which have been giving approximately 3 months of  relief.  He does mention today that he feels like he is closer to wanting to pursue further action.  Patient is requesting repeat injections today which were performed into both knees without complication.  Can plan to follow-up as needed or may consider referral to discuss further treatment options.  Plan :    - Bilateral knee cortisone injections performed today - Return to clinic as needed       Procedure Note  Patient: Chris Hamilton             Date of Birth: [birth year]           MRN: [id number]             Visit Date: 04/26/2024  Procedures: Visit Diagnoses:  1. Unilateral primary osteoarthritis, right knee   2. Unilateral primary osteoarthritis, left knee   3. Bilateral chronic knee pain       Large Joint Inj: bilateral knee on 04/26/2024 9:21 AM Indications: pain Details: 22 G 1.5 in needle, anterolateral approach Medications (Right): 4 mL lidocaine  1 %; 2 mL triamcinolone  acetonide 40 MG/ML Medications (Left): 4 mL lidocaine  1 %; 2 mL triamcinolone  acetonide 40 MG/ML Outcome: tolerated well, no immediate complications Procedure, treatment alternatives, risks and benefits explained, specific risks discussed. Consent was given by the patient. Immediately prior to procedure a time out was called to verify the correct patient, procedure, equipment, support staff and site/side marked as required. Patient was prepped and draped in the usual sterile fashion.      I personally saw and evaluated the patient, and participated in the management and treatment plan.   Leonce Reveal, PA-C Orthopedics

## 2024-04-27 ENCOUNTER — Encounter (HOSPITAL_BASED_OUTPATIENT_CLINIC_OR_DEPARTMENT_OTHER): Payer: Self-pay

## 2024-05-27 ENCOUNTER — Encounter: Payer: Self-pay | Admitting: Radiology

## 2024-07-29 ENCOUNTER — Ambulatory Visit (HOSPITAL_BASED_OUTPATIENT_CLINIC_OR_DEPARTMENT_OTHER): Admitting: Student

## 2024-07-30 ENCOUNTER — Ambulatory Visit (HOSPITAL_BASED_OUTPATIENT_CLINIC_OR_DEPARTMENT_OTHER): Admitting: Student

## 2024-07-30 DIAGNOSIS — M1711 Unilateral primary osteoarthritis, right knee: Secondary | ICD-10-CM | POA: Diagnosis not present

## 2024-07-30 DIAGNOSIS — M1712 Unilateral primary osteoarthritis, left knee: Secondary | ICD-10-CM

## 2024-07-30 MED ORDER — LIDOCAINE HCL 1 % IJ SOLN
4.0000 mL | INTRAMUSCULAR | Status: AC | PRN
  Administered 2024-07-30: 4 mL

## 2024-07-30 NOTE — Progress Notes (Signed)
 "                                Chief Complaint: Bilateral knee pain     History of Present Illness:   Chris Hamilton is a 49 y.o. male presenting today for evaluation of bilateral knee pain.  He has been continuing to manage his symptoms with fairly consistent cortisone injections in order to hold off any surgical intervention for as long as possible.  He was last seen for injections 3 months ago and reports that these did work really well and have allowed him to continue with work and daily activities.  Pain continues present mainly within the medial aspects of both knees.  Denies any other significant changes.  Surgical History:   None  PMH/PSH/Family History/Social History/Meds/Allergies:    Past Medical History:  Diagnosis Date   Anxiety    Arthritis    Depression    Hepatitis C, chronic (HCC)    Incarcerated umbilical hernia    S/P repair by Dr. Signe 09/22/2021   IV drug abuse (HCC)    Shingles 2022   Past Surgical History:  Procedure Laterality Date   CHOLECYSTECTOMY N/A 11/16/2021   Procedure: LAPAROSCOPIC CHOLECYSTECTOMY;  Surgeon: Eletha Boas, MD;  Location: [facility];  Service: General;  Laterality: N/A;   INCISIONAL HERNIA REPAIR N/A 05/03/2022   Procedure: LAPAROSCOPIC INCISIONAL/VENTRAL HERNIA REPAIR;  Surgeon: Signe Mitzie LABOR, MD;  Location: [facility];  Service: General;  Laterality: N/A;   UMBILICAL HERNIA REPAIR N/A 09/22/2021   Procedure: OPEN UMBILICAL HERNIA REPAIR WITH MESH;  Surgeon: Signe Mitzie LABOR, MD;  Location: [facility];  Service: General;  Laterality: N/A;   XI ROBOTIC ASSISTED VENTRAL HERNIA N/A 03/08/2023   Procedure: ROBOTIC RECURRENT INCISIONAL HERNIA REPAIR WITH MESH, BILATERAL POSTERIOR RECTUS MYOFASCIAL RELEASE, BILATERAL TRANSVERSUS ABDOMINIS RELEASE, PARTIAL REMOVAL OF INTRAPERITONEAL MESH;  Surgeon: Lyndel Deward PARAS, MD;  Location: [facility];  Service: General;  Laterality: N/A;   Social History   Socioeconomic  History   Marital status: Single    Spouse name: Not on file   Number of children: Not on file   Years of education: Not on file   Highest education level: 12th grade  Occupational History   Not on file  Tobacco Use   Smoking status: Every Day    Current packs/day: 0.00    Average packs/day: 1 pack/day for 20.0 years (20.0 ttl pk-yrs)    Types: Cigarettes    Start date: 2003    Last attempt to quit: 2023    Years since quitting: 3.0   Smokeless tobacco: Never   Tobacco comments:    Vapes daily. Quit cigarettes  Vaping Use   Vaping status: Every Day   Substances: Nicotine, Flavoring  Substance and Sexual Activity   Alcohol use: Not Currently   Drug use: Not Currently    Types: Methamphetamines, Heroin, Cocaine, MDMA (Ecstacy), LSD, Marijuana, Amphetamines, Oxycodone , Benzodiazepines    Comment: past meth and heroin use, currently 03/08/23 takes 30 Kratom per day   Sexual activity: Yes  Other Topics Concern   Not on file  Social History Narrative   Not on file   Social Drivers of Health   Tobacco Use: High Risk (01/25/2024)   Patient History    Smoking Tobacco Use: Every Day    Smokeless Tobacco Use: Never    Passive Exposure: Not on file  Financial Resource Strain: Low Risk (  05/25/2023)   Overall Financial Resource Strain (CARDIA)    Difficulty of Paying Living Expenses: Not very hard  Food Insecurity: Unknown (05/25/2023)   Hunger Vital Sign    Worried About Running Out of Food in the Last Year: Patient declined    Ran Out of Food in the Last Year: Never true  Transportation Needs: No Transportation Needs (05/25/2023)   PRAPARE - Administrator, Civil Service (Medical): No    Lack of Transportation (Non-Medical): No  Physical Activity: Unknown (05/25/2023)   Exercise Vital Sign    Days of Exercise per Week: Patient declined    Minutes of Exercise per Session: Not on file  Stress: No Stress Concern Present (05/25/2023)   [employer] of Occupational  Health - Occupational Stress Questionnaire    Feeling of Stress : Only a little  Social Connections: Socially Isolated (05/25/2023)   Social Connection and Isolation Panel    Frequency of Communication with Friends and Family: More than three times a week    Frequency of Social Gatherings with Friends and Family: Patient declined    Attends Religious Services: Never    Database Administrator or Organizations: No    Attends Engineer, Structural: Not on file    Marital Status: Never married  Depression (PHQ2-9): Low Risk (08/30/2022)   Depression (PHQ2-9)    PHQ-2 Score: 0  Alcohol Screen: Not on file  Housing: Low Risk (05/25/2023)   Housing    Last Housing Risk Score: 0  Utilities: Not At Risk (03/08/2023)   [employer] Utilities    Threatened with loss of utilities: No  Health Literacy: Not on file   Family History  Problem Relation Age of Onset   Cancer Mother    Miscarriages / Stillbirths Mother    Cancer Father    Diabetes Maternal Grandmother    Cancer Maternal Grandmother    Heart disease Neg Hx    No Known Allergies Current Outpatient Medications  Medication Sig Dispense Refill   albuterol  (VENTOLIN  HFA) 108 (90 Base) MCG/ACT inhaler Inhale 2 puffs into the lungs every 6 (six) hours as needed for wheezing or shortness of breath. (Patient not taking: Reported on 05/26/2023) 8 g 2   tobramycin  (TOBREX ) 0.3 % ophthalmic solution Place 1 drop into the right eye every 6 (six) hours. 5 mL 0   No current facility-administered medications for this visit.   No results found.  Review of Systems:   A ROS was performed including pertinent positives and negatives as documented in the HPI.  Physical Exam :   Constitutional: NAD and appears stated age Neurological: Alert and oriented Psych: Appropriate affect and cooperative There were no vitals taken for this visit.   Comprehensive Musculoskeletal Exam:    Tenderness in both knees along the medial joint lines.  Active range  of motion from 0 to 120 degrees.  Patient is ambulating well without assistive device.  No laxity with varus or valgus stress.  No significant effusions present today.  Imaging:     Assessment:   49 y.o. male with chronic bilateral knee pain in the setting of osteoarthritis which is most progressed in the medial compartments.  He does continue to do well with intra-articular cortisone injections in order to delay any type of surgical intervention.  Injections have been providing close to 3 months of relief.  Patient would like to repeat injections today and as it has been over 3 months since he last had these done, I  do feel like this is reasonable.  Injections were performed into both knees from the anterolateral approach and he tolerated these very well.  He will follow-up as needed.  Plan :    - Bilateral knee cortisone injections performed today - Return to clinic as needed       Procedure Note  Patient: Chris Hamilton             Date of Birth: [birth year]           MRN: [id number]             Visit Date: 07/30/2024  Procedures: Visit Diagnoses:  1. Unilateral primary osteoarthritis, right knee   2. Unilateral primary osteoarthritis, left knee       Large Joint Inj: bilateral knee on 07/30/2024 4:32 PM Indications: pain Details: 22 G 1.5 in needle, anterolateral approach Medications (Right): 4 mL lidocaine  1 % (2 mL betamethasone) Medications (Left): 4 mL lidocaine  1 % (2 mL betamethasone) Outcome: tolerated well, no immediate complications Procedure, treatment alternatives, risks and benefits explained, specific risks discussed. Consent was given by the patient. Immediately prior to procedure a time out was called to verify the correct patient, procedure, equipment, support staff and site/side marked as required. Patient was prepped and draped in the usual sterile fashion.      I personally saw and evaluated the patient, and participated in the management and  treatment plan.   Leonce Reveal, PA-C Orthopedics "
